# Patient Record
Sex: Female | Born: 1970 | Race: White | Hispanic: No | Smoking: Never smoker
Health system: Southern US, Community
[De-identification: ages and names within clinical notes are randomized; demographics above are authoritative.]

## PROBLEM LIST (undated history)

## (undated) DIAGNOSIS — I10 Essential (primary) hypertension: Secondary | ICD-10-CM

## (undated) DIAGNOSIS — F32A Depression, unspecified: Secondary | ICD-10-CM

## (undated) DIAGNOSIS — M51369 Other intervertebral disc degeneration, lumbar region without mention of lumbar back pain or lower extremity pain: Secondary | ICD-10-CM

## (undated) DIAGNOSIS — G4730 Sleep apnea, unspecified: Secondary | ICD-10-CM

## (undated) DIAGNOSIS — F329 Major depressive disorder, single episode, unspecified: Secondary | ICD-10-CM

## (undated) DIAGNOSIS — E039 Hypothyroidism, unspecified: Secondary | ICD-10-CM

## (undated) DIAGNOSIS — K219 Gastro-esophageal reflux disease without esophagitis: Secondary | ICD-10-CM

## (undated) DIAGNOSIS — F410 Panic disorder [episodic paroxysmal anxiety] without agoraphobia: Secondary | ICD-10-CM

## (undated) DIAGNOSIS — N189 Chronic kidney disease, unspecified: Secondary | ICD-10-CM

## (undated) DIAGNOSIS — Z87442 Personal history of urinary calculi: Secondary | ICD-10-CM

## (undated) DIAGNOSIS — D649 Anemia, unspecified: Secondary | ICD-10-CM

## (undated) DIAGNOSIS — M1990 Unspecified osteoarthritis, unspecified site: Secondary | ICD-10-CM

## (undated) DIAGNOSIS — R7303 Prediabetes: Secondary | ICD-10-CM

## (undated) DIAGNOSIS — I499 Cardiac arrhythmia, unspecified: Secondary | ICD-10-CM

## (undated) DIAGNOSIS — M5136 Other intervertebral disc degeneration, lumbar region: Secondary | ICD-10-CM

## (undated) DIAGNOSIS — Z9689 Presence of other specified functional implants: Secondary | ICD-10-CM

## (undated) DIAGNOSIS — R519 Headache, unspecified: Secondary | ICD-10-CM

## (undated) DIAGNOSIS — J189 Pneumonia, unspecified organism: Secondary | ICD-10-CM

## (undated) DIAGNOSIS — F411 Generalized anxiety disorder: Secondary | ICD-10-CM

## (undated) DIAGNOSIS — I493 Ventricular premature depolarization: Secondary | ICD-10-CM

## (undated) HISTORY — PX: CHOLECYSTECTOMY: SHX55

## (undated) HISTORY — PX: TUBAL LIGATION: SHX77

## (undated) HISTORY — DX: Hypothyroidism, unspecified: E03.9

## (undated) HISTORY — PX: COLONOSCOPY: SHX174

## (undated) HISTORY — DX: Depression, unspecified: F32.A

## (undated) HISTORY — PX: ANKLE SURGERY: SHX546

---
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
## 2010-06-28 HISTORY — PX: OTHER SURGICAL HISTORY: SHX169

## 2010-07-19 ENCOUNTER — Encounter: Payer: Self-pay | Admitting: Unknown Physician Specialty

## 2015-10-27 HISTORY — PX: COLONOSCOPY: SHX174

## 2015-11-13 ENCOUNTER — Encounter: Payer: Self-pay | Admitting: Gastroenterology

## 2016-06-28 HISTORY — PX: OTHER SURGICAL HISTORY: SHX169

## 2017-06-01 ENCOUNTER — Ambulatory Visit: Payer: Medicare Other | Admitting: Sports Medicine

## 2017-06-01 ENCOUNTER — Ambulatory Visit (INDEPENDENT_AMBULATORY_CARE_PROVIDER_SITE_OTHER): Payer: Medicare Other

## 2017-06-01 ENCOUNTER — Encounter: Payer: Self-pay | Admitting: Sports Medicine

## 2017-06-01 VITALS — BP 125/83 | HR 123 | Ht 63.0 in | Wt 289.0 lb

## 2017-06-01 DIAGNOSIS — M79671 Pain in right foot: Secondary | ICD-10-CM | POA: Diagnosis not present

## 2017-06-01 DIAGNOSIS — M79672 Pain in left foot: Secondary | ICD-10-CM | POA: Diagnosis not present

## 2017-06-01 DIAGNOSIS — M722 Plantar fascial fibromatosis: Secondary | ICD-10-CM

## 2017-06-01 MED ORDER — METHYLPREDNISOLONE 4 MG PO TBPK: ORAL_TABLET | ORAL | 0 refills | Status: DC

## 2017-06-01 MED ORDER — MELOXICAM 15 MG PO TABS: 15.0000 mg | ORAL_TABLET | Freq: Every day | ORAL | 0 refills | Status: DC

## 2017-06-01 MED ORDER — TRIAMCINOLONE ACETONIDE 10 MG/ML IJ SUSP: 10.0000 mg | Freq: Once | INTRAMUSCULAR | Status: DC

## 2017-06-01 NOTE — Progress Notes (Signed)
   Subjective:    Patient ID: Natasha Cole, female    DOB: January 12, 1971, 46 y.o.   MRN: [id number]  HPI    Review of Systems  HENT: Positive for sinus pressure and sinus pain.   Musculoskeletal: Positive for arthralgias, back pain, gait problem and myalgias.  Allergic/Immunologic: Positive for environmental allergies.  Neurological: Positive for weakness.  All other systems reviewed and are negative.      Objective:   Physical Exam        Assessment & Plan:

## 2017-06-01 NOTE — Patient Instructions (Signed)

## 2017-06-01 NOTE — Progress Notes (Signed)
Subjective: Natasha Cole is a 46 y.o. female patient presents to office with complaint of heel pain on the left and right. 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 "Just hurts". Patient has treated this problem with rest and shoes with no relief. Denies any other pedal complaints.   Prediabetic   ROS per nurse note  There are no active problems to display for this patient.   Current Outpatient Medications on File Prior to Visit  Medication Sig Dispense Refill  . busPIRone (BUSPAR) 15 MG tablet   0  . imipramine (TOFRANIL) 25 MG tablet   0  . levothyroxine (SYNTHROID, LEVOTHROID) 50 MCG tablet Take by mouth.    . lithium carbonate 150 MG capsule   0  . mirtazapine (REMERON) 45 MG tablet   0  . rosuvastatin (CRESTOR) 10 MG tablet TAKE 1 TABLET BY MOUTH ONCE DAILY    . vitamin B-12 (CYANOCOBALAMIN) 1000 MCG tablet Take by mouth.    . vitamin E 400 UNIT capsule Take by mouth.     No current facility-administered medications on file prior to visit.     No Known Allergies  Objective: Physical Exam General: The patient is alert and oriented x3 in no acute distress. Overweight   Dermatology: Old scar at right ankle. Skin is warm, dry and supple bilateral lower extremities. Nails 1-10 are normal. There is no erythema, edema, no eccymosis, no open lesions present. Integument is otherwise unremarkable.  Vascular: Dorsalis Pedis pulse and Posterior Tibial pulse are 2/4 bilateral. Capillary fill time is immediate to all digits.  Neurological: Grossly intact to light touch with an achilles reflex of +2/5 and a  negative Tinel's sign bilateral.  Musculoskeletal: Tenderness to palpation at the central calcaneal tubercale and through the insertion of the plantar fascia on the left and right foot. No pain with compression of calcaneus bilateral. No pain with tuning fork to calcaneus  bilateral. No pain with calf compression bilateral. There is decreased Ankle joint range of motion bilateral. All other joints range of motion within normal limits bilateral. Strength 5/5 in all groups bilateral.   Gait: Unassisted, Antalgic avoid weight on heels  Xray, Right/Left foot:  Large habitus. Normal osseous mineralization. Joint spaces preserved except at midfoot on right. No acute fracture/dislocation/boney destruction, + Ankle hardware from previous fracture years ago. Calcaneal spur present with mild thickening of plantar fascia. No other soft tissue abnormalities or radiopaque foreign bodies.   Assessment and Plan: Problem List Items Addressed This Visit    None    Visit Diagnoses    Plantar fasciitis    -  Primary   Relevant Medications   triamcinolone acetonide (KENALOG) 10 MG/ML injection 10 mg (Start on 06/01/2017  3:45 PM)   methylPREDNISolone (MEDROL DOSEPAK) 4 MG TBPK tablet   meloxicam (MOBIC) 15 MG tablet   Other Relevant Orders   DG Foot Complete Right   DG Foot Complete Left   Heel pain, bilateral          -Complete examination performed.  -Xrays reviewed -Discussed with patient in detail the condition of plantar fasciitis, how this occurs and general treatment options. Explained both conservative and surgical treatments.  -After oral consent and aseptic prep, injected a mixture containing 1 ml of 2%  plain lidocaine, 1 ml 0.5% plain marcaine, 0.5 ml of kenalog 10 and 0.5 ml of dexamethasone phosphate  into left and right heel. Post-injection care discussed with patient.  -Rx Meloxicam to start after Medrol dose pack is completed -Recommended good supportive shoes and advised use of OTC insert. Explained to patient that if these orthoses work well, we will continue with these. If these do not improve her condition and  pain, we will consider custom molded orthoses. -Explained and dispensed to patient daily stretching exercises. -Recommend patient to ice affected  area 1-2x daily. -Patient to return to office in 4 weeks for follow up or sooner if problems or questions arise.If no better will add on fascial bracing or night splint and consider re-injection next visit.   Asencion Islamitorya Mitch Arquette, DPM

## 2017-06-30 ENCOUNTER — Encounter: Payer: Self-pay | Admitting: Sports Medicine

## 2017-06-30 ENCOUNTER — Ambulatory Visit: Payer: Medicare Other | Admitting: Sports Medicine

## 2017-06-30 DIAGNOSIS — M79671 Pain in right foot: Secondary | ICD-10-CM

## 2017-06-30 DIAGNOSIS — M79672 Pain in left foot: Secondary | ICD-10-CM

## 2017-06-30 DIAGNOSIS — M722 Plantar fascial fibromatosis: Secondary | ICD-10-CM | POA: Diagnosis not present

## 2017-06-30 NOTE — Progress Notes (Signed)
  Subjective: Natasha Cole is a 47 y.o. female returns to office for follow up evaluation after Left and Right heel injection for plantar fasciitis, injection #1 administered 4 weeks ago. Patient states that the injection seems to help her pain; pain is now 1/10 and has decreased in frequency to the area. Patient denies any recent changes in medications or new problems since last visit.   Denies swelling/joint pain/maisea/vomiting/fever/chills/night sweats.  There are no active problems to display for this patient.   Current Outpatient Medications on File Prior to Visit  Medication Sig Dispense Refill  . busPIRone (BUSPAR) 15 MG tablet   0  . imipramine (TOFRANIL) 25 MG tablet   0  . levothyroxine (SYNTHROID, LEVOTHROID) 50 MCG tablet Take by mouth.    . lithium carbonate 150 MG capsule   0  . meloxicam (MOBIC) 15 MG tablet Take 1 tablet (15 mg total) by mouth daily. 30 tablet 0  . methylPREDNISolone (MEDROL DOSEPAK) 4 MG TBPK tablet Take as instructed 21 tablet 0  . mirtazapine (REMERON) 45 MG tablet   0  . rosuvastatin (CRESTOR) 10 MG tablet TAKE 1 TABLET BY MOUTH ONCE DAILY    . vitamin B-12 (CYANOCOBALAMIN) 1000 MCG tablet Take by mouth.    . vitamin E 400 UNIT capsule Take by mouth.     Current Facility-Administered Medications on File Prior to Visit  Medication Dose Route Frequency Provider Last Rate Last Dose  . triamcinolone acetonide (KENALOG) 10 MG/ML injection 10 mg  10 mg Other Once Asencion IslamStover, Lisamarie Coke, DPM        No Known Allergies  Objective:   General:  Alert and oriented x 3, in no acute distress. Obese female   Dermatology: Skin is warm, dry, and supple bilateral. Nails are within normal limits. There is no lower extremity erythema, no eccymosis, no open lesions present bilateral.   Vascular: Dorsalis Pedis and Posterior Tibial pedal pulses are 2/4 bilateral. + hair growth noted bilateral. Capillary Fill Time is 3 seconds in all digits. No varicosities, No edema  bilateral lower extremities.   Neurological: Sensation grossly intact to light touch bilateral.   Musculoskeletal: There is no tenderness to palpation at the medial calcaneal tubercale and through the insertion of the plantar fascia on the Left or right foot. No pain with compression to calcaneus or application of tuning fork. There is decreased Ankle joint range of motion bilateral. All other joints range of motion  within normal limits bilateral. Pes planus foot type. Strength 5/5 bilateral.   Assessment and Plan: Problem List Items Addressed This Visit    None    Visit Diagnoses    Plantar fasciitis    -  Primary   Heel pain, bilateral         -Complete examination performed.  -Previous x-rays reviewed. -Discussed with patient long term care for much improved plantar fasciitis  -Continue with stretching, icing, good supportive shoes daily for prevention -Recommend patient to consider custom orthotics; office will call patient after coverage has been checked and if patient wants them then will put her on casting schedule   -Patient to return to office as needed/casting or sooner if problems or questions arise.  Asencion Islamitorya Deegan Valentino, DPM

## 2017-07-12 DIAGNOSIS — I517 Cardiomegaly: Secondary | ICD-10-CM | POA: Diagnosis not present

## 2017-07-12 DIAGNOSIS — N183 Chronic kidney disease, stage 3 (moderate): Secondary | ICD-10-CM | POA: Diagnosis not present

## 2017-07-15 ENCOUNTER — Encounter: Payer: Self-pay | Admitting: Sports Medicine

## 2017-07-15 ENCOUNTER — Ambulatory Visit: Payer: Medicare Other | Admitting: Sports Medicine

## 2017-07-15 DIAGNOSIS — M79672 Pain in left foot: Secondary | ICD-10-CM

## 2017-07-15 DIAGNOSIS — M79671 Pain in right foot: Secondary | ICD-10-CM

## 2017-07-15 DIAGNOSIS — M722 Plantar fascial fibromatosis: Secondary | ICD-10-CM

## 2017-07-15 MED ORDER — MELOXICAM 15 MG PO TABS: 15.0000 mg | ORAL_TABLET | Freq: Every day | ORAL | 0 refills | Status: DC

## 2017-07-15 MED ORDER — TRIAMCINOLONE ACETONIDE 40 MG/ML IJ SUSP: 20.0000 mg | Freq: Once | INTRAMUSCULAR | Status: DC

## 2017-07-15 NOTE — Progress Notes (Signed)
Subjective: Natasha Cole is a 47 y.o. female returns to office for follow up evaluation after Left and Right heel injection for plantar fasciitis, injection #1 administered 6 weeks ago. Patient states that the pain started to come back about a week ago after she finished all of her medications and it is more painful than before.  Patient denies any recent changes in medications or new problems since last visit.   Denies swelling/joint pain/nausea/vomiting/fever/chills/night sweats.  There are no active problems to display for this patient.   Current Outpatient Medications on File Prior to Visit  Medication Sig Dispense Refill  . busPIRone (BUSPAR) 15 MG tablet   0  . imipramine (TOFRANIL) 25 MG tablet   0  . levothyroxine (SYNTHROID, LEVOTHROID) 50 MCG tablet Take by mouth.    . lithium carbonate 150 MG capsule   0  . methylPREDNISolone (MEDROL DOSEPAK) 4 MG TBPK tablet Take as instructed 21 tablet 0  . mirtazapine (REMERON) 45 MG tablet   0  . rosuvastatin (CRESTOR) 10 MG tablet TAKE 1 TABLET BY MOUTH ONCE DAILY    . vitamin B-12 (CYANOCOBALAMIN) 1000 MCG tablet Take by mouth.    . vitamin E 400 UNIT capsule Take by mouth.     Current Facility-Administered Medications on File Prior to Visit  Medication Dose Route Frequency Provider Last Rate Last Dose  . triamcinolone acetonide (KENALOG) 10 MG/ML injection 10 mg  10 mg Other Once Asencion IslamStover, Alka Falwell, DPM        No Known Allergies  Objective:   General:  Alert and oriented x 3, in no acute distress. Obese female   Dermatology: Skin is warm, dry, and supple bilateral. Nails are within normal limits. There is no lower extremity erythema, no eccymosis, no open lesions present bilateral.   Vascular: Dorsalis Pedis and Posterior Tibial pedal pulses are 2/4 bilateral. + hair growth noted bilateral. Capillary Fill Time is 3 seconds in all digits. No varicosities, No edema bilateral lower extremities.   Neurological: Sensation grossly  intact to light touch bilateral.   Musculoskeletal: There is tenderness to palpation at the medial calcaneal tubercale and through the insertion of the plantar fascia on the Left or right foot. No pain with compression to calcaneus or application of tuning fork. There is decreased Ankle joint range of motion bilateral. All other joints range of motion  within normal limits bilateral. Pes planus foot type. Strength 5/5 bilateral.   Assessment and Plan: Problem List Items Addressed This Visit    None    Visit Diagnoses    Plantar fasciitis    -  Primary   Relevant Medications   meloxicam (MOBIC) 15 MG tablet   triamcinolone acetonide (KENALOG-40) injection 20 mg   Heel pain, bilateral       Relevant Medications   triamcinolone acetonide (KENALOG-40) injection 20 mg     -Complete examination performed.  -Previous x-rays reviewed. -Discussed with patient long term care for much improved plantar fasciitis  After oral consent and aseptic prep, injected a mixture containing 1 ml of 2%  plain lidocaine, 1 ml 0.5% plain marcaine, 0.5 ml of kenalog 40 and 0.5 ml of dexamethasone phosphate into right and left heel without complication. Post-injection care discussed with patient.  -Dispensed night splint -Refilled meloxicam -Continue with stretching, icing, good supportive shoes daily for prevention; Recommend power steps or super feet   -Patient to return to office as 4-5 weeks for follow-up evaluation or sooner if problems or questions arise.  Asencion Islamitorya Melbert Botelho, DPM

## 2017-07-15 NOTE — Patient Instructions (Signed)
For tennis shoes recommend:  Anne [employer]utter[employer]oks Beast Ascis New balance Saucony Can be purchased at [employer] or [employer]  Vionic  SAS Can be purchased at [employer] or [employer]   For work shoes recommend: [employer] boots  Can be purchased at a variety of places or [employer]   For casual shoes recommend: Vionic  Can be purchased at [employer] or [employer]   For [employer] Orth[employer] Power steps- sold in our office Super feet- Can be purchased at [employer] or [employer]

## 2017-08-19 ENCOUNTER — Ambulatory Visit: Payer: Medicare Other | Admitting: Sports Medicine

## 2017-08-23 ENCOUNTER — Telehealth: Payer: Self-pay

## 2017-08-23 MED ORDER — MELOXICAM 15 MG PO TABS: 15.0000 mg | ORAL_TABLET | Freq: Every day | ORAL | 0 refills | Status: DC

## 2017-08-23 NOTE — Telephone Encounter (Signed)
Yes ok to refill and then make appt for 4-6 weeks for follow up Thanks Dr. Marylene LandStover

## 2017-08-23 NOTE — Telephone Encounter (Signed)
Patient called stating that the Meloxicam that was prescribed for her helps with her ankle pain, but when she stops taking it the ankle pain returns.

## 2017-08-23 NOTE — Telephone Encounter (Signed)
Spoke with patient, informing her of the refill of her meloxicam and to follow up in 4-6 weeks with Dr Marylene LandStover

## 2017-10-21 ENCOUNTER — Encounter

## 2017-10-21 ENCOUNTER — Ambulatory Visit: Payer: Medicare Other | Admitting: Sports Medicine

## 2017-10-21 ENCOUNTER — Encounter: Payer: Self-pay | Admitting: Sports Medicine

## 2017-10-21 DIAGNOSIS — M79671 Pain in right foot: Secondary | ICD-10-CM | POA: Diagnosis not present

## 2017-10-21 DIAGNOSIS — M79672 Pain in left foot: Secondary | ICD-10-CM | POA: Diagnosis not present

## 2017-10-21 DIAGNOSIS — M722 Plantar fascial fibromatosis: Secondary | ICD-10-CM | POA: Diagnosis not present

## 2017-10-21 MED ORDER — DICLOFENAC SODIUM 75 MG PO TBEC: 75.0000 mg | DELAYED_RELEASE_TABLET | Freq: Two times a day (BID) | ORAL | 0 refills | Status: DC

## 2017-10-21 NOTE — Progress Notes (Signed)
Subjective: Natasha Cole is a 47 y.o. female returns to office for follow up evaluation after Left and Right heel pain.  Patient had last injection 3 months ago. Patient states pain is now back to the 10 and reports that she has been doing everything stretching icing wearing night splint good supportive shoes cushions pads with no additional relief has completed her meloxicam which helps a little.  Patient denies any recent changes in medications or new problems since last visit.   Denies swelling/joint pain/nausea/vomiting/fever/chills/night sweats.  There are no active problems to display for this patient.   Current Outpatient Medications on File Prior to Visit  Medication Sig Dispense Refill  . busPIRone (BUSPAR) 15 MG tablet   0  . imipramine (TOFRANIL) 25 MG tablet   0  . levothyroxine (SYNTHROID, LEVOTHROID) 50 MCG tablet Take by mouth.    . lithium carbonate 150 MG capsule   0  . meloxicam (MOBIC) 15 MG tablet Take 1 tablet (15 mg total) by mouth daily. 30 tablet 0  . meloxicam (MOBIC) 15 MG tablet Take 1 tablet (15 mg total) by mouth daily. 30 tablet 0  . methylPREDNISolone (MEDROL DOSEPAK) 4 MG TBPK tablet Take as instructed 21 tablet 0  . mirtazapine (REMERON) 45 MG tablet   0  . rosuvastatin (CRESTOR) 10 MG tablet TAKE 1 TABLET BY MOUTH ONCE DAILY    . vitamin B-12 (CYANOCOBALAMIN) 1000 MCG tablet Take by mouth.    . vitamin E 400 UNIT capsule Take by mouth.     Current Facility-Administered Medications on File Prior to Visit  Medication Dose Route Frequency Provider Last Rate Last Dose  . triamcinolone acetonide (KENALOG) 10 MG/ML injection 10 mg  10 mg Other Once [place], Reichen Hutzler, DPM      . triamcinolone acetonide (KENALOG-40) injection 20 mg  20 mg Other Once Asencion Islam, DPM        No Known Allergies  Objective:   General:  Alert and oriented x 3, in no acute distress. Obese female   Dermatology: Skin is warm, dry, and supple bilateral. Nails are within  normal limits. There is no lower extremity erythema, no eccymosis, no open lesions present bilateral.   Vascular: Dorsalis Pedis and Posterior Tibial pedal pulses are 2/4 bilateral. + hair growth noted bilateral. Capillary Fill Time is 3 seconds in all digits. No varicosities, No edema bilateral lower extremities.   Neurological: Sensation grossly intact to light touch bilateral.   Musculoskeletal: There is tenderness to palpation at the medial calcaneal tubercale and through the insertion of the plantar fascia on the Left or right foot. No pain with compression to calcaneus or application of tuning fork. There is decreased Ankle joint range of motion bilateral. All other joints range of motion  within normal limits bilateral. Pes planus foot type. Strength 5/5 bilateral.   Assessment and Plan: Problem List Items Addressed This Visit    None    Visit Diagnoses    Plantar fasciitis    -  Primary   Heel pain, bilateral         -Complete examination performed.  -Previous x-rays reviewed. -Discussed with patient long term care for recurrent plantar fasciitis  -Advised patient to consider physical therapy, shockwave therapy or surgery patient would like to think about these options and call back to let us know what she decides. -Change meloxicam to diclofenac to see if this will give patient any additional relief -Continue with night splint, stretching, icing, good supportive shoes and insoles daily  for prevention of worsening of symptoms -Patient to return to office after physical therapy or shockwave treatment or for surgery consult once she decides what she wants to do or sooner if problems or questions arise.  Asencion Islamitorya Meggan Dhaliwal, DPM

## 2017-10-26 ENCOUNTER — Ambulatory Visit: Payer: Medicare Other | Admitting: Sports Medicine

## 2017-11-02 ENCOUNTER — Telehealth: Payer: Self-pay | Admitting: Sports Medicine

## 2017-11-02 MED ORDER — DICLOFENAC SODIUM 75 MG PO TBEC: 75.0000 mg | DELAYED_RELEASE_TABLET | Freq: Two times a day (BID) | ORAL | 0 refills | Status: DC

## 2017-11-02 NOTE — Telephone Encounter (Signed)
I was calling to see if Dr. Marylene Land would call in a refill on the Voltaren. It is helping some so I wanted to know if she could refill it for a month's worth. Thank you.

## 2017-11-02 NOTE — Addendum Note (Signed)
Addended by: Alphia Kava D on: 11/02/2017 11:24 AM   Modules accepted: Orders

## 2017-11-02 NOTE — Telephone Encounter (Signed)
Left message informing pt, Dr. Marylene Land had refilled the diclofenac and if she continued to have problems to make an appt to see Dr. Marylene Land.

## 2017-11-22 ENCOUNTER — Other Ambulatory Visit: Payer: Self-pay | Admitting: Sports Medicine

## 2017-12-07 ENCOUNTER — Other Ambulatory Visit: Payer: Self-pay | Admitting: Sports Medicine

## 2017-12-09 ENCOUNTER — Ambulatory Visit: Payer: Medicare Other | Admitting: Sports Medicine

## 2017-12-14 ENCOUNTER — Ambulatory Visit: Payer: Medicare Other | Admitting: Sports Medicine

## 2017-12-14 ENCOUNTER — Encounter: Payer: Self-pay | Admitting: Sports Medicine

## 2017-12-14 DIAGNOSIS — M722 Plantar fascial fibromatosis: Secondary | ICD-10-CM | POA: Diagnosis not present

## 2017-12-14 DIAGNOSIS — M79671 Pain in right foot: Secondary | ICD-10-CM | POA: Diagnosis not present

## 2017-12-14 DIAGNOSIS — M79672 Pain in left foot: Secondary | ICD-10-CM

## 2017-12-14 MED ORDER — DICLOFENAC SODIUM 75 MG PO TBEC: 75.0000 mg | DELAYED_RELEASE_TABLET | Freq: Two times a day (BID) | ORAL | 5 refills | Status: DC

## 2017-12-14 NOTE — Progress Notes (Signed)
Subjective: Natasha Cole is a 47 y.o. female returns to office for follow up evaluation after Left and Right heel pain.  Patient reports that pain is doing better the only time she has pain is when she gets up to go to the bathroom in the middle of the night states that the diclofenac medication has really helped and she would like to continue on this.  Patient denies any recent changes in medications or new problems since last visit.   Denies swelling/joint pain/nausea/vomiting/fever/chills/night sweats.  There are no active problems to display for this patient.   Current Outpatient Medications on File Prior to Visit  Medication Sig Dispense Refill  . busPIRone (BUSPAR) 15 MG tablet   0  . imipramine (TOFRANIL) 25 MG tablet   0  . levothyroxine (SYNTHROID, LEVOTHROID) 50 MCG tablet Take by mouth.    . lithium carbonate 150 MG capsule   0  . meloxicam (MOBIC) 15 MG tablet Take 1 tablet (15 mg total) by mouth daily. 30 tablet 0  . meloxicam (MOBIC) 15 MG tablet Take 1 tablet (15 mg total) by mouth daily. 30 tablet 0  . methylPREDNISolone (MEDROL DOSEPAK) 4 MG TBPK tablet Take as instructed 21 tablet 0  . mirtazapine (REMERON) 45 MG tablet   0  . rosuvastatin (CRESTOR) 10 MG tablet TAKE 1 TABLET BY MOUTH ONCE DAILY    . vitamin B-12 (CYANOCOBALAMIN) 1000 MCG tablet Take by mouth.    . vitamin E 400 UNIT capsule Take by mouth.     Current Facility-Administered Medications on File Prior to Visit  Medication Dose Route Frequency Provider Last Rate Last Dose  . triamcinolone acetonide (KENALOG) 10 MG/ML injection 10 mg  10 mg Other Once [place], Aslin Farinas, DPM      . triamcinolone acetonide (KENALOG-40) injection 20 mg  20 mg Other Once Asencion Islam, DPM        No Known Allergies  Objective:   General:  Alert and oriented x 3, in no acute distress. Obese female   Dermatology: Skin is warm, dry, and supple bilateral. Nails are within normal limits. There is no lower extremity  erythema, no eccymosis, no open lesions present bilateral.   Vascular: Dorsalis Pedis and Posterior Tibial pedal pulses are 2/4 bilateral. + hair growth noted bilateral. Capillary Fill Time is 3 seconds in all digits. No varicosities, No edema bilateral lower extremities.   Neurological: Sensation grossly intact to light touch bilateral.   Musculoskeletal: There is minimal tenderness to palpation at the medial calcaneal tubercale and through the insertion of the plantar fascia on the Left or right foot. No pain with compression to calcaneus or application of tuning fork. There is decreased Ankle joint range of motion bilateral. All other joints range of motion  within normal limits bilateral. Pes planus foot type. Strength 5/5 bilateral.   Assessment and Plan: Problem List Items Addressed This Visit    None    Visit Diagnoses    Plantar fasciitis    -  Primary   Relevant Medications   diclofenac (VOLTAREN) 75 MG EC tablet   Heel pain, bilateral       Relevant Medications   diclofenac (VOLTAREN) 75 MG EC tablet     -Complete examination performed.  -Previous x-rays reviewed. -Discussed with patient long term care for plantar fasciitis  -Refill diclofenac -Continue with night splint, stretching, icing, good supportive shoes and insoles daily for prevention of worsening of symptoms -Patient to return to office as needed or sooner if problems  or questions arise.  Advised patient if she experiences a flare and should strongly consider physical therapy, shockwave treatment, or surgery patient expressed understanding and states that if pain worsens she will return and let us know if she decides on doing any of these more advanced treatments.  Asencion Islamitorya Addilyn Satterwhite, DPM

## 2018-04-12 ENCOUNTER — Other Ambulatory Visit: Payer: Self-pay | Admitting: Psychiatry

## 2018-04-17 ENCOUNTER — Other Ambulatory Visit: Payer: Self-pay | Admitting: Psychiatry

## 2018-05-08 ENCOUNTER — Other Ambulatory Visit: Payer: Self-pay | Admitting: Psychiatry

## 2018-05-12 ENCOUNTER — Ambulatory Visit: Payer: Self-pay | Admitting: Psychiatry

## 2018-05-15 ENCOUNTER — Other Ambulatory Visit: Payer: Self-pay | Admitting: Psychiatry

## 2018-05-30 ENCOUNTER — Encounter: Payer: Self-pay | Admitting: Emergency Medicine

## 2018-05-30 DIAGNOSIS — F419 Anxiety disorder, unspecified: Secondary | ICD-10-CM | POA: Insufficient documentation

## 2018-05-30 DIAGNOSIS — F32A Depression, unspecified: Secondary | ICD-10-CM | POA: Insufficient documentation

## 2018-05-30 DIAGNOSIS — F329 Major depressive disorder, single episode, unspecified: Secondary | ICD-10-CM | POA: Insufficient documentation

## 2018-06-05 ENCOUNTER — Other Ambulatory Visit: Payer: Self-pay | Admitting: Psychiatry

## 2018-06-05 NOTE — Telephone Encounter (Signed)
Need to review paper chart  

## 2018-06-12 ENCOUNTER — Encounter (INDEPENDENT_AMBULATORY_CARE_PROVIDER_SITE_OTHER): Payer: Self-pay

## 2018-06-12 ENCOUNTER — Ambulatory Visit: Payer: Medicare Other | Admitting: Psychiatry

## 2018-06-12 DIAGNOSIS — F3289 Other specified depressive episodes: Secondary | ICD-10-CM | POA: Diagnosis not present

## 2018-06-12 DIAGNOSIS — F419 Anxiety disorder, unspecified: Secondary | ICD-10-CM

## 2018-06-12 MED ORDER — LITHIUM CARBONATE 150 MG PO CAPS: 450.0000 mg | ORAL_CAPSULE | Freq: Every day | ORAL | 3 refills | Status: DC

## 2018-06-12 MED ORDER — PRAMIPEXOLE DIHYDROCHLORIDE 0.125 MG PO TABS: ORAL_TABLET | ORAL | 2 refills | Status: DC

## 2018-06-12 MED ORDER — IMIPRAMINE HCL 25 MG PO TABS: 25.0000 mg | ORAL_TABLET | Freq: Every day | ORAL | 2 refills | Status: DC

## 2018-06-12 MED ORDER — BUSPIRONE HCL 15 MG PO TABS: 30.0000 mg | ORAL_TABLET | Freq: Two times a day (BID) | ORAL | 2 refills | Status: DC

## 2018-06-12 MED ORDER — MIRTAZAPINE 45 MG PO TABS: 45.0000 mg | ORAL_TABLET | Freq: Every day | ORAL | 2 refills | Status: DC

## 2018-06-12 NOTE — Progress Notes (Signed)
[facility]  Patient ID: Natasha Cole,  MRN: [id number]  PCP: Krystal ClarkBrown-Patram, Melissa Joyce, NP  Date of Evaluation: 06/12/2018 Time spent:20 minutes  Chief Complaint:   HISTORY/CURRENT STATUS: HPI patient seen 01/26/2018.  At the time her depression and memory were the same but anxiety was worse.  She tried Aricept for her memory but that had side effects so she stopped. Currently the anxiety is the same, depression may be slightly better without suicidal thoughts, Memory about the same.  Individual Medical History/ Review of Systems: Changes? :No   Allergies: Patient has no known allergies.  Current Medications:  Current Outpatient Medications:  .  busPIRone (BUSPAR) 15 MG tablet, Take 2 tablets (30 mg total) by mouth 2 (two) times daily., Disp: 120 tablet, Rfl: 2 .  imipramine (TOFRANIL) 10 MG tablet, TAKE 4 TABLETS BY MOUTH EVERY NIGHT AT BEDTIME, Disp: 120 tablet, Rfl: 0 .  lithium carbonate 150 MG capsule, Take 3 capsules (450 mg total) by mouth daily., Disp: 90 capsule, Rfl: 3 .  mirtazapine (REMERON) 45 MG tablet, Take 1 tablet (45 mg total) by mouth daily. as directed, Disp: 30 tablet, Rfl: 2 .  diclofenac (VOLTAREN) 75 MG EC tablet, Take 1 tablet (75 mg total) by mouth 2 (two) times daily., Disp: 60 tablet, Rfl: 5 .  donepezil (ARICEPT) 5 MG tablet, Take 5 mg by mouth at bedtime., Disp: , Rfl:  .  levothyroxine (SYNTHROID, LEVOTHROID) 50 MCG tablet, Take by mouth., Disp: , Rfl:  .  meloxicam (MOBIC) 15 MG tablet, Take 1 tablet (15 mg total) by mouth daily., Disp: 30 tablet, Rfl: 0 .  meloxicam (MOBIC) 15 MG tablet, Take 1 tablet (15 mg total) by mouth daily., Disp: 30 tablet, Rfl: 0 .  methylPREDNISolone (MEDROL DOSEPAK) 4 MG TBPK tablet, Take as instructed, Disp: 21 tablet, Rfl: 0 .  pramipexole (MIRAPEX) 0.125 MG tablet, Bid for a week, then 2 bid, Disp: 120 tablet, Rfl: 2 .  rosuvastatin (CRESTOR) 10 MG tablet, TAKE 1 TABLET BY MOUTH ONCE DAILY, Disp: ,  Rfl:  .  vitamin B-12 (CYANOCOBALAMIN) 1000 MCG tablet, Take by mouth., Disp: , Rfl:  .  vitamin E 400 UNIT capsule, Take by mouth., Disp: , Rfl:   Current Facility-Administered Medications:  .  triamcinolone acetonide (KENALOG) 10 MG/ML injection 10 mg, 10 mg, Other, Once, Stover, Titorya, DPM .  triamcinolone acetonide (KENALOG-40) injection 20 mg, 20 mg, Other, Once, Asencion IslamStover, Titorya, DPM Medication Side Effects: none  Family Medical/ Social History: Changes? none  MENTAL HEALTH EXAM:  There were no vitals taken for this visit.There is no height or weight on file to calculate BMI.  General Appearance: Casual  Eye Contact:  Fair  Speech:  Normal Rate  Volume:  Normal  Mood:  Depressed anxious  Affect:  Appropriate  Thought Process:  Goal Directed  Orientation:  Full (Time, Place, and Person)  Thought Content: Logical   Suicidal Thoughts:  No  Homicidal Thoughts:  No  Memory:  impaired  Judgement:  Fair  Insight:  Fair  Psychomotor Activity:  Normal  Concentration:  Concentration: Fair  Recall:  Good  Fund of Knowledge: Good  Language: Good  Assets:  Social Support  ADL's:  Intact  Cognition: WNL  Prognosis:  Good    DIAGNOSES:    ICD-10-CM   1. Other depression F32.89   2. Anxiety F41.9     Receiving Psychotherapy: No    RECOMMENDATIONS: Overall the patient may be doing slightly better.  Still has  problems with lack of memory and feeling numb.  Also complains of itching for months for a year. I want her to decrease her imipramine from 40 mg a day to 25 mg a day to see if that benefits her itching.  She can use Benadryl for itching.  She denies rash or shortness of breath.  Continue lithium 150  3 a day continue BuSpar 15 mg 2 twice a day.  Continue Remeron 45 mg at bed.  She is to start Mirapex 0.125 twice a day for a week and then 2 pills twice a day. I have her do a lithium level and creatinine.  She needs creatinine level every 6 months. She is to return in 1  month.   Anne Fu, PA-C

## 2018-07-05 LAB — CREATININE, SERUM
Creatinine, Ser: 1.12 mg/dL — ABNORMAL HIGH (ref 0.57–1.00)
GFR calc Af Amer: 67 mL/min/{1.73_m2} (ref >59)
GFR calc non Af Amer: 58 mL/min/{1.73_m2} — ABNORMAL LOW (ref >59)

## 2018-07-05 LAB — LITHIUM LEVEL: LITHIUM LVL: 0.4 mmol/L — AB (ref 0.6–1.2)

## 2018-07-05 NOTE — Progress Notes (Signed)
Natasha Cole, Please call pt with results. Needs to increase Lithium 150mg  to 4/day Tell her of cr result, better than 2018 results. Send labs to pcp I will discuss serotonin syndrome at revisit. Thanks, Mat Carne

## 2018-07-06 ENCOUNTER — Other Ambulatory Visit: Payer: Self-pay

## 2018-07-06 MED ORDER — LITHIUM CARBONATE 150 MG PO CAPS: 600.0000 mg | ORAL_CAPSULE | Freq: Every day | ORAL | 3 refills | Status: DC

## 2018-07-06 NOTE — Progress Notes (Signed)
Pt given information and verbalized understanding of side effects, etc. Needs Rx sent to [facility] with updated dosage.

## 2018-07-10 ENCOUNTER — Other Ambulatory Visit: Payer: Self-pay | Admitting: Psychiatry

## 2018-07-10 ENCOUNTER — Ambulatory Visit: Payer: Medicare Other | Admitting: Psychiatry

## 2018-07-17 ENCOUNTER — Telehealth: Payer: Self-pay | Admitting: Psychiatry

## 2018-07-17 NOTE — Telephone Encounter (Signed)
Patient called and said she wants to know which medicine you talked about it be the one that makes her itch. She also needs to know how to go down on the dosage on thst medicine. Please give her a call at [phone].

## 2018-08-07 ENCOUNTER — Other Ambulatory Visit: Payer: Self-pay | Admitting: Psychiatry

## 2018-08-09 ENCOUNTER — Other Ambulatory Visit: Payer: Self-pay | Admitting: Psychiatry

## 2018-08-10 NOTE — Telephone Encounter (Signed)
Please address

## 2018-08-14 ENCOUNTER — Other Ambulatory Visit: Payer: Self-pay | Admitting: Psychiatry

## 2018-09-11 ENCOUNTER — Other Ambulatory Visit: Payer: Self-pay | Admitting: Psychiatry

## 2018-10-09 ENCOUNTER — Other Ambulatory Visit: Payer: Self-pay | Admitting: Psychiatry

## 2018-10-09 NOTE — Telephone Encounter (Signed)
I'll go ahead and send in but needs to make an appt or no more RF.

## 2018-10-09 NOTE — Telephone Encounter (Signed)
Hasn't been back in to follow up with Mat Carne since 05/2018, refills ok and schedule appt with you or ?  Thanks

## 2018-10-26 ENCOUNTER — Ambulatory Visit: Payer: Medicare Other | Admitting: Physician Assistant

## 2018-10-26 ENCOUNTER — Other Ambulatory Visit: Payer: Self-pay

## 2018-10-26 ENCOUNTER — Encounter: Payer: Self-pay | Admitting: Physician Assistant

## 2018-10-26 DIAGNOSIS — F331 Major depressive disorder, recurrent, moderate: Secondary | ICD-10-CM

## 2018-10-26 DIAGNOSIS — F411 Generalized anxiety disorder: Secondary | ICD-10-CM

## 2018-10-26 MED ORDER — LITHIUM CARBONATE 150 MG PO CAPS: 600.0000 mg | ORAL_CAPSULE | Freq: Every day | ORAL | 1 refills | Status: DC

## 2018-10-26 MED ORDER — MIRTAZAPINE 45 MG PO TABS: 45.0000 mg | ORAL_TABLET | Freq: Every day | ORAL | 1 refills | Status: DC

## 2018-10-26 MED ORDER — BUSPIRONE HCL 30 MG PO TABS: 30.0000 mg | ORAL_TABLET | Freq: Two times a day (BID) | ORAL | 1 refills | Status: DC

## 2018-10-26 MED ORDER — IMIPRAMINE HCL 10 MG PO TABS: ORAL_TABLET | ORAL | 0 refills | Status: DC

## 2018-10-26 MED ORDER — PRAMIPEXOLE DIHYDROCHLORIDE 0.125 MG PO TABS: 0.2500 mg | ORAL_TABLET | Freq: Every day | ORAL | 1 refills | Status: DC

## 2018-10-26 NOTE — Progress Notes (Signed)
[facility]  Patient ID: Natasha Cole,  MRN: [id number]  PCP: Krystal ClarkBrown-Patram, Melissa Joyce, NP  Date of Evaluation: 10/26/2018 Time spent:15 minutes  Chief Complaint:  Chief Complaint    Follow-up     Virtual Visit via Telephone Note  I connected with patient by a video enabled telemedicine application or telephone, with their informed consent, and verified patient privacy and that I am speaking with the correct person using two identifiers.  I am private, in my home and the patient is home.   I discussed the limitations, risks, security and privacy concerns of performing an evaluation and management service by telephone and the availability of in person appointments. I also discussed with the patient that there may be a patient responsible charge related to this service. The patient expressed understanding and agreed to proceed.   I discussed the assessment and treatment plan with the patient. The patient was provided an opportunity to ask questions and all were answered. The patient agreed with the plan and demonstrated an understanding of the instructions.   The patient was advised to call back or seek an in-person evaluation if the symptoms worsen or if the condition fails to improve as anticipated.  I provided 15 minutes of non-face-to-face time during this encounter.  HISTORY/CURRENT STATUS: HPI for routine med check.  And that is a former patient of my colleague, Anne FuClay Shugart, [place] who recently passed away.  The patient is being transferred to my care.  Patient reports that she still has itching all over every day.  She denies any rash.  She reports this happened once the imipramine was added.  It improved slightly when the dose was decreased about 3 months ago.  She does not feel like the drug has helped her anyway so wonders if she can go off of it.  Overall she states she is doing well.  Her mood is good. Patient denies loss of interest in usual activities and  is able to enjoy things.  Denies decreased energy or motivation.  Appetite has not changed.  No extreme sadness, tearfulness, or feelings of hopelessness.  Denies any changes in concentration, making decisions or remembering things.  Denies suicidal or homicidal thoughts.  She sleeps pretty well and the pramipexole helps with the jerking and weird movements in her legs.  Well controlled with current treatment.  No panic attacks.  Denies muscle or joint pain, stiffness, or dystonia.  Denies dizziness, syncope, seizures, numbness, tingling, tremor, tics, unsteady gait, slurred speech, confusion.   Individual Medical History/ Review of Systems: Changes? :No    Past medications for mental health diagnoses include: unknown  Allergies: Patient has no known allergies.  Current Medications:  Current Outpatient Medications:  .  levothyroxine (SYNTHROID, LEVOTHROID) 50 MCG tablet, Take by mouth., Disp: , Rfl:  .  lithium carbonate 150 MG capsule, Take 4 capsules (600 mg total) by mouth daily., Disp: 360 capsule, Rfl: 1 .  mirtazapine (REMERON) 45 MG tablet, Take 1 tablet (45 mg total) by mouth daily. as directed, Disp: 90 tablet, Rfl: 1 .  pramipexole (MIRAPEX) 0.125 MG tablet, Take 2 tablets (0.25 mg total) by mouth at bedtime., Disp: 180 tablet, Rfl: 1 .  rosuvastatin (CRESTOR) 10 MG tablet, TAKE 1 TABLET BY MOUTH ONCE DAILY, Disp: , Rfl:  .  vitamin E 400 UNIT capsule, Take by mouth., Disp: , Rfl:  .  busPIRone (BUSPAR) 30 MG tablet, Take 1 tablet (30 mg total) by mouth 2 (two) times daily., Disp: 180 tablet,  Rfl: 1 .  diclofenac (VOLTAREN) 75 MG EC tablet, Take 1 tablet (75 mg total) by mouth 2 (two) times daily. (Patient not taking: Reported on 10/26/2018), Disp: 60 tablet, Rfl: 5 .  imipramine (TOFRANIL) 10 MG tablet, Take 2 po qhs for 4 nights, then 1 po qd for 4 days, then stop., Disp: 11 tablet, Rfl: 0 .  meloxicam (MOBIC) 15 MG tablet, Take 1 tablet (15 mg total) by mouth daily. (Patient not  taking: Reported on 10/26/2018), Disp: 30 tablet, Rfl: 0 .  meloxicam (MOBIC) 15 MG tablet, Take 1 tablet (15 mg total) by mouth daily. (Patient not taking: Reported on 10/26/2018), Disp: 30 tablet, Rfl: 0 .  methylPREDNISolone (MEDROL DOSEPAK) 4 MG TBPK tablet, Take as instructed (Patient not taking: Reported on 10/26/2018), Disp: 21 tablet, Rfl: 0 .  vitamin B-12 (CYANOCOBALAMIN) 1000 MCG tablet, Take by mouth., Disp: , Rfl:   Current Facility-Administered Medications:  .  triamcinolone acetonide (KENALOG) 10 MG/ML injection 10 mg, 10 mg, Other, Once, Stover, Titorya, DPM .  triamcinolone acetonide (KENALOG-40) injection 20 mg, 20 mg, Other, Once, Asencion Islam, DPM Medication Side Effects: none  Family Medical/ Social History: Changes? No  MENTAL HEALTH EXAM:  There were no vitals taken for this visit.There is no height or weight on file to calculate BMI.  General Appearance: unable to assess  Eye Contact:  unable to assess  Speech:  Clear and Coherent  Volume:  Normal  Mood:  Euthymic  Affect:  unable to assess  Thought Process:  Goal Directed  Orientation:  Full (Time, Place, and Person)  Thought Content: Logical   Suicidal Thoughts:  No  Homicidal Thoughts:  No  Memory:  WNL  Judgement:  Good  Insight:  Good  Psychomotor Activity:  unable to assess  Concentration:  Concentration: Good  Recall:  Good  Fund of Knowledge: Good  Language: Good  Assets:  Desire for Improvement  ADL's:  Intact  Cognition: WNL  Prognosis:  Good  Labs 07/04/2018 lithium was 0.4, creatinine was 1.12  DIAGNOSES:    ICD-10-CM   1. Generalized anxiety disorder F41.1   2. Major depressive disorder, recurrent episode, moderate (HCC) F33.1     Receiving Psychotherapy: No    RECOMMENDATIONS:  Wean off imipramine 10 mg 2 p.o. nightly for 4 nights then 1 p.o. nightly for 4 nights and then stop. Continue BuSpar 30 mg 1 twice daily. Continue lithium 600 mg daily. Continue Remeron 45 mg  nightly Continue pramipexole 0.125 mg, 2 nightly. Return in 6 months or sooner as needed.  Melony Overly, PA-C   This record has been created using [employer].  Chart creation errors have been sought, but may not always have been located and corrected. Such creation errors do not reflect on the standard of medical care.

## 2018-12-18 ENCOUNTER — Telehealth: Payer: Self-pay | Admitting: Physician Assistant

## 2018-12-18 ENCOUNTER — Other Ambulatory Visit: Payer: Self-pay

## 2018-12-18 MED ORDER — PRAMIPEXOLE DIHYDROCHLORIDE 0.125 MG PO TABS: 0.2500 mg | ORAL_TABLET | Freq: Two times a day (BID) | ORAL | 0 refills | Status: DC

## 2018-12-18 NOTE — Telephone Encounter (Signed)
Please let her know I'm sorry, that was my mistake.  Send in pramipexole 0.125 #120  Sig 2 po bid.  Rf 3.  Or I can send in. Need pharmacy.

## 2018-12-18 NOTE — Telephone Encounter (Signed)
rx submitted.  

## 2018-12-18 NOTE — Telephone Encounter (Signed)
Patient stated script for Marapex was different on how much she is to take per day before she was taking 2 pills 2x a day, the new script states take 2 pills @ bedtime.

## 2019-01-01 ENCOUNTER — Encounter: Payer: Self-pay | Admitting: Allergy and Immunology

## 2019-01-01 ENCOUNTER — Other Ambulatory Visit: Payer: Self-pay

## 2019-01-01 ENCOUNTER — Ambulatory Visit: Payer: Medicare Other | Admitting: Allergy and Immunology

## 2019-01-01 VITALS — BP 152/92 | HR 76 | Temp 98.0°F | Resp 16 | Ht 63.78 in | Wt 325.0 lb

## 2019-01-01 DIAGNOSIS — L299 Pruritus, unspecified: Secondary | ICD-10-CM | POA: Diagnosis not present

## 2019-01-01 DIAGNOSIS — D721 Eosinophilia, unspecified: Secondary | ICD-10-CM

## 2019-01-01 DIAGNOSIS — L308 Other specified dermatitis: Secondary | ICD-10-CM | POA: Diagnosis not present

## 2019-01-01 DIAGNOSIS — K219 Gastro-esophageal reflux disease without esophagitis: Secondary | ICD-10-CM | POA: Diagnosis not present

## 2019-01-01 DIAGNOSIS — L989 Disorder of the skin and subcutaneous tissue, unspecified: Secondary | ICD-10-CM

## 2019-01-01 MED ORDER — MOMETASONE FUROATE 0.1 % EX OINT: TOPICAL_OINTMENT | Freq: Every day | CUTANEOUS | 5 refills | Status: DC

## 2019-01-01 MED ORDER — FAMOTIDINE 20 MG PO TABS: 20.0000 mg | ORAL_TABLET | Freq: Two times a day (BID) | ORAL | 5 refills | Status: DC

## 2019-01-01 MED ORDER — MONTELUKAST SODIUM 10 MG PO TABS: 10.0000 mg | ORAL_TABLET | Freq: Every day | ORAL | 5 refills | Status: DC

## 2019-01-01 NOTE — Patient Instructions (Addendum)
  1.  Allergen avoidance measures?  2.  Cetirizine 10 mg - 1-2 tablets 1-2 times a day (40 mg max)  3.  Famotidine 20 mg - 1 tablet twice a day  4.  Montelukast 10 mg - 1 tablet once a day  5.  Shower followed by mometasone 0.1% ointment to damage skin daily until healed  6.  Review all blood tests.  Further testing?  7.  Return to clinic in 3 weeks or earlier if problem

## 2019-01-01 NOTE — Progress Notes (Signed)
[place]   Dear Dr. Sylvan CheeseBrown-Patram,  Thank you for referring Natasha Cole to the [facility] [place] on 01/01/2019.   Below is a summation of this patient's evaluation and recommendations.  Thank you for your referral. I will keep you informed about this patient's response to treatment.   If you have any questions please do not hesitate to contact me.   Sincerely,  Jessica PriestEric J. Leonilda Cozby, MD Allergy / Immunology [facility]   ______________________________________________________________________    NEW PATIENT NOTE  Referring Provider: Rhea BleacherBrown-Patram, Melissa J* Primary Provider: Krystal ClarkBrown-Patram, Melissa Joyce, NP Date of office visit: 01/01/2019    Subjective:   Chief Complaint:  Natasha Cole (DOB: August 17, 1970) is a 48 y.o. female who presents to the clinic on 01/01/2019 with a chief complaint of Pruritis (for 8 months) .     HPI: Natasha Cole presents to this clinic in evaluation of itching.  For at least 8 months if not longer she has had global itchiness.  She does not really notice a dermatitis other than the fact that if she excoriates her skin to a large extent it becomes very red and sometimes she will develop thickened blotchy areas.  She itches the back of her neck and her forearms for the most part.  She has no associated systemic or constitutional symptoms.  Specifically, there is no associated respiratory or GI symptoms or fever or night sweats or malaise.  She does have a history of allergic rhinitis with sneezing in the morning or following exposure to pollen but this has been present for decades.  She does have regurgitation and nausea over the course of the past few months which is an intermittent issue.  There is no obvious trigger giving rise to this issue.  She has not really had any new medications or new environmental changes.  She does  not use any supplements or health foods that are new.  She has had a few medications discontinued for the past 4 months but this has not really help this issue.  She has tried various medications which have not really helped very much.  She has tried Benadryl and other antihistamines and most recently hydroxyzine.  She has changed her laundry detergent and her body wash and her moisturizer.  Past Medical History:  Diagnosis Date  . Depression   . Hypothyroidism     Past Surgical History:  Procedure Laterality Date  . ANKLE SURGERY    . CHOLECYSTECTOMY    . gastic bypass  2012  . reversal gastric bypass  2018  . TUBAL LIGATION      Allergies as of 01/01/2019   No Known Allergies     Medication List     busPIRone 30 MG tablet Commonly known as: BUSPAR Take 1 tablet (30 mg total) by mouth 2 (two) times daily.   hydrOXYzine 25 MG tablet Commonly known as: ATARAX/VISTARIL Take 1 tablet by mouth daily.   levothyroxine 50 MCG tablet Commonly known as: SYNTHROID Take by mouth.   lithium carbonate 150 MG capsule Take 4 capsules (600 mg total) by mouth daily.   mirtazapine 45 MG tablet Commonly known as: REMERON Take 1 tablet (45 mg total) by mouth daily. as directed   pramipexole 0.125 MG tablet Commonly known as: MIRAPEX Take 2 tablets (0.25 mg total) by mouth 2 (two) times a day.   rosuvastatin 10 MG tablet Commonly known  as: CRESTOR TAKE 1 TABLET BY MOUTH ONCE DAILY   vitamin E 400 UNIT capsule Take by mouth.       Review of systems negative except as noted in HPI / PMHx or noted below:  Review of Systems  Constitutional: Negative.   HENT: Negative.   Eyes: Negative.   Respiratory: Negative.   Cardiovascular: Negative.   Gastrointestinal: Negative.   Genitourinary: Negative.   Musculoskeletal: Negative.   Skin: Negative.   Neurological: Negative.   Endo/Heme/Allergies: Negative.   Psychiatric/Behavioral: Negative.     Family History  Problem  Relation Age of Onset  . Allergic rhinitis Father   . Hypertension Father   . Stroke Father   . Hypertension Brother   . Hypercholesterolemia Brother     Social History   Socioeconomic History  . Marital status: Divorced    Spouse name: Not on file  . Number of children: Not on file  . Years of education: Not on file  . Highest education level: Not on file  Occupational History  . Not on file  Social Needs  . Financial resource strain: Not on file  . Food insecurity    Worry: Not on file    Inability: Not on file  . Transportation needs    Medical: Not on file    Non-medical: Not on file  Tobacco Use  . Smoking status: Never Smoker  . Smokeless tobacco: Never Used  Substance and Sexual Activity  . Alcohol use: Not Currently  . Drug use: Not Currently  . Sexual activity: Not on file  Lifestyle  . Physical activity    Days per week: Not on file    Minutes per session: Not on file  . Stress: Not on file  Relationships  . Social Musicianconnections    Talks on phone: Not on file    Gets together: Not on file    Attends religious service: Not on file    Active member of club or organization: Not on file    Attends meetings of clubs or organizations: Not on file    Relationship status: Not on file  . Intimate partner violence    Fear of current or ex partner: Not on file    Emotionally abused: Not on file    Physically abused: Not on file    Forced sexual activity: Not on file  Other Topics Concern  . Not on file  Social History Narrative  . Not on file    Environmental and Social history  Lives in a house with a dry environment, a dog located inside the household, no carpet in the bedroom, plastic on the bed, plastic on the pillow, no smokers located to the household.  Objective:   Vitals:   01/01/19 0938  BP: (!) 152/92  Pulse: 76  Resp: 16  Temp: 98 F (36.7 C)  SpO2: 99%   Height: 5' 3.78" (162 cm) Weight: (!) 325 lb (147.4 kg)  Physical Exam  Constitutional:      Appearance: She is not diaphoretic.  HENT:     Head: Normocephalic.     Right Ear: Tympanic membrane, ear canal and external ear normal.     Left Ear: Tympanic membrane, ear canal and external ear normal.     Nose: Nose normal. No mucosal edema or rhinorrhea.     Mouth/Throat:     Pharynx: Uvula midline. No oropharyngeal exudate.  Eyes:     Conjunctiva/sclera: Conjunctivae normal.  Neck:     Thyroid: No  thyromegaly.     Trachea: Trachea normal. No tracheal tenderness or tracheal deviation.  Cardiovascular:     Rate and Rhythm: Normal rate and regular rhythm.     Heart sounds: Normal heart sounds, S1 normal and S2 normal. No murmur.  Pulmonary:     Effort: No respiratory distress.     Breath sounds: Normal breath sounds. No stridor. No wheezing or rales.  Lymphadenopathy:     Head:     Right side of head: No tonsillar adenopathy.     Left side of head: No tonsillar adenopathy.     Cervical: No cervical adenopathy.  Skin:    Findings: No erythema or rash.     Nails: There is no clubbing.   Neurological:     Mental Status: She is alert.     Diagnostics: Allergy skin tests were not performed.   Results of blood tests obtained 22 December 2018 identified WBC 6.9, absolute eosinophil 500, absolute lymphocyte 1600, hemoglobin 13.5, platelet 381, sed rate 11, creatinine 1.20 mg/DL, AST 14 U/L, ALT 12 U/L, negative ANA  Assessment and Plan:    1. Pruritic disorder   2. Inflammatory dermatosis   3. Gastroesophageal reflux disease, esophagitis presence not specified   4. Eosinophilia     1.  Allergen avoidance measures?  2.  Cetirizine 10 mg - 1-2 tablets 1-2 times a day (40 mg max)  3.  Famotidine 20 mg - 1 tablet twice a day  4.  Montelukast 10 mg - 1 tablet once a day  5.  Shower followed by mometasone 0.1% ointment to damage skin daily until healed  6.  Review all blood tests.  Further testing?  7.  Return to clinic in 3 weeks or earlier if  problem  Harley has some form of immunological hyperreactivity manifested as pruritus and occasional inflammation of her skin in the setting of eosinophilia.  We will have her utilize a collection of medications in the hope of minimizing this overactivity and I am going to review all of her blood test that of been performed in the past and consider further evaluation for systemic disease contributing to this immunological hyperreactivity.  I will see her back in this clinic in 3 weeks or earlier if there is a problem.  Jiles Prows, MD Allergy / Immunology [facility]

## 2019-01-02 ENCOUNTER — Other Ambulatory Visit: Payer: Self-pay | Admitting: Physician Assistant

## 2019-01-02 ENCOUNTER — Encounter: Payer: Self-pay | Admitting: Allergy and Immunology

## 2019-01-02 ENCOUNTER — Telehealth: Payer: Self-pay | Admitting: Physician Assistant

## 2019-01-02 ENCOUNTER — Other Ambulatory Visit: Payer: Self-pay

## 2019-01-02 MED ORDER — IMIPRAMINE HCL 10 MG PO TABS: ORAL_TABLET | ORAL | 0 refills | Status: DC

## 2019-01-02 NOTE — Progress Notes (Signed)
Reviewed meds for insomnia

## 2019-01-02 NOTE — Telephone Encounter (Signed)
Pt called to advise not sleeping well for a while now. Talked to PCP was advised to tell you. Ask for Rx . [phone]

## 2019-01-02 NOTE — Telephone Encounter (Signed)
Pt willing to try imipramine again, itching did not resolve so that was not the issue. She doesn't remember if her sleep was affected before or after that.  Instructed her that I would send in new rx and to follow up with no improvements. She agrees.

## 2019-01-02 NOTE — Telephone Encounter (Signed)
Please ask her if this got worse after she stopped the imipramine.  Also, did the itching improve after she went off the imipramine?  I think adding that back in would be helpful.  If she is willing to try it again and needs a prescription let me know.  I will send in imipramine 10 mg #60, 1-2 nightly as needed sleep.  No refills.

## 2019-01-05 ENCOUNTER — Other Ambulatory Visit: Payer: Self-pay | Admitting: *Deleted

## 2019-01-05 ENCOUNTER — Telehealth: Payer: Self-pay | Admitting: Allergy and Immunology

## 2019-01-05 MED ORDER — HYDROXYZINE PAMOATE 25 MG PO CAPS: ORAL_CAPSULE | ORAL | 0 refills | Status: DC

## 2019-01-05 NOTE — Telephone Encounter (Signed)
Natasha Cole called the office and stated she saw Dr. Neldon Mc earlier this week for itching.  Dr. Neldon Mc prescribed her medication and she has followed the plan as stated in her AVS.  Natasha Cole is still having itching and wants to know how long is she supposed to still have itching before it gets better?  Natasha Cole would like to know if there is anything else that can be done to help with the itching.  Please advise.

## 2019-01-05 NOTE — Telephone Encounter (Signed)
She is taking the max dose of Cetirzine (40 mgs daily). She wants to try the Hydroxyzine so I will sent the script to [facility].

## 2019-01-05 NOTE — Telephone Encounter (Signed)
Is she taking the max dose of Cetirizine (40mg  max)?   Please ensure she is moisturizing well after showering.   From the notes it appears itching has been ongoing for 8 months thus I do not have an answer as to how long it may continue to last.  Dr. Neldon Mc may consider additional labwork to evaluate.     If she would like to try hydroxyzine over the weekend to see if she can get some immediate relief of symptoms or to help if sleep is affected she can try hydroxyzine 25mg  at bedtime.

## 2019-01-08 NOTE — Telephone Encounter (Signed)
[employer] called today to let us know that the Hydroxyzine has been approved for one year. Both member and pharmacy have been made aware.

## 2019-01-24 ENCOUNTER — Ambulatory Visit (INDEPENDENT_AMBULATORY_CARE_PROVIDER_SITE_OTHER): Payer: Medicare Other | Admitting: Allergy and Immunology

## 2019-01-24 ENCOUNTER — Encounter: Payer: Self-pay | Admitting: Allergy and Immunology

## 2019-01-24 ENCOUNTER — Other Ambulatory Visit: Payer: Self-pay

## 2019-01-24 VITALS — BP 148/100 | HR 84 | Temp 98.3°F | Resp 14

## 2019-01-24 DIAGNOSIS — L308 Other specified dermatitis: Secondary | ICD-10-CM

## 2019-01-24 DIAGNOSIS — D721 Eosinophilia, unspecified: Secondary | ICD-10-CM

## 2019-01-24 DIAGNOSIS — L299 Pruritus, unspecified: Secondary | ICD-10-CM | POA: Diagnosis not present

## 2019-01-24 DIAGNOSIS — L989 Disorder of the skin and subcutaneous tissue, unspecified: Secondary | ICD-10-CM

## 2019-01-24 DIAGNOSIS — K219 Gastro-esophageal reflux disease without esophagitis: Secondary | ICD-10-CM

## 2019-01-24 NOTE — Progress Notes (Signed)
Natasha Cole returns to this clinic to have skin testing performed.  She presented to this clinic for initial evaluation on 01 January 2019 with a pruritic disorder and inflammatory dermatosis and reflux and eosinophilia.  While utilizing a combination of cetirizine and famotidine and montelukast she is much better and has had a significant decrease in intensity and frequency of itching and has resolved her GI issues.  Allergy skin testing was performed.  She demonstrated significant hypersensitivity against grasses, weeds, and trees.  She will perform allergen avoidance measures as best as possible and continue to use her medical plan established during her last visit and I will see her back in this clinic in 8 weeks which will be a total of 12 weeks of treatment.

## 2019-01-25 ENCOUNTER — Encounter: Payer: Self-pay | Admitting: Allergy and Immunology

## 2019-01-29 ENCOUNTER — Other Ambulatory Visit: Payer: Self-pay | Admitting: Allergy

## 2019-01-29 ENCOUNTER — Other Ambulatory Visit: Payer: Self-pay | Admitting: Physician Assistant

## 2019-01-30 ENCOUNTER — Encounter: Payer: Self-pay | Admitting: *Deleted

## 2019-01-31 ENCOUNTER — Telehealth: Payer: Self-pay | Admitting: Physician Assistant

## 2019-01-31 NOTE — Telephone Encounter (Signed)
Natasha Cole called to request refill of her mirapex. The dose was increased to 2 tabs BID.  Send in script for the new dose.  Next appt 10/27.  Send to [facility] pharmacy

## 2019-02-01 ENCOUNTER — Other Ambulatory Visit: Payer: Self-pay

## 2019-02-01 MED ORDER — PRAMIPEXOLE DIHYDROCHLORIDE 0.125 MG PO TABS: 0.2500 mg | ORAL_TABLET | Freq: Two times a day (BID) | ORAL | 0 refills | Status: DC

## 2019-02-01 NOTE — Telephone Encounter (Signed)
rx for 90 day already submitted to [facility] but will resubmit in case they haven't received.

## 2019-02-27 ENCOUNTER — Other Ambulatory Visit: Payer: Self-pay | Admitting: *Deleted

## 2019-02-27 MED ORDER — HYDROXYZINE PAMOATE 25 MG PO CAPS: ORAL_CAPSULE | ORAL | 3 refills | Status: DC

## 2019-03-21 ENCOUNTER — Ambulatory Visit: Payer: Medicare Other | Admitting: Allergy and Immunology

## 2019-04-02 ENCOUNTER — Other Ambulatory Visit: Payer: Self-pay | Admitting: Physician Assistant

## 2019-04-24 ENCOUNTER — Ambulatory Visit: Payer: Medicare Other | Admitting: Physician Assistant

## 2019-04-30 ENCOUNTER — Other Ambulatory Visit: Payer: Self-pay | Admitting: Physician Assistant

## 2019-05-14 ENCOUNTER — Other Ambulatory Visit: Payer: Self-pay | Admitting: Physician Assistant

## 2019-05-14 ENCOUNTER — Telehealth: Payer: Self-pay | Admitting: *Deleted

## 2019-05-14 ENCOUNTER — Other Ambulatory Visit: Payer: Self-pay

## 2019-05-14 MED ORDER — PREDNISONE 5 MG PO TABS: ORAL_TABLET | ORAL | 0 refills | Status: DC

## 2019-05-14 NOTE — Telephone Encounter (Signed)
Called and asked patient about coming in for appointment.  She is unable to come in this week.  She is not allowed to drive currently, so she has to get transportation.  Would there be anything you could suggest over the phone for her to try this week? Please advise.

## 2019-05-14 NOTE — Telephone Encounter (Signed)
Patient called to let us know that she feels like her medication is no longer working. Patient said she began itching again about four weeks ago. Patient would like a call back for advice.

## 2019-05-14 NOTE — Telephone Encounter (Signed)
Please have her come to clinic on Wednesday or Thursday as it has been 4 months since have seen her in this clinic and we can go over a few issues about further options for evaluation and treatment.

## 2019-05-14 NOTE — Telephone Encounter (Signed)
Called and spoke with patient she stated that her itching is not as bad as it was back in July however for the last 4 weeks patient stated that it is gradually getting worse each week. She stated that she is taking her Zyrtec, Pepcid and montelukast and feels there are no longer working. Patient is wondering what else she can do for her itching. Please advise.

## 2019-05-14 NOTE — Telephone Encounter (Signed)
Please make sure she is using the following agents:   2.  Cetirizine 10 mg - 1-2 tablets 1-2 times a day (40 mg max)  3.  Famotidine 20 mg - 1 tablet twice a day  4.  Montelukast 10 mg - 1 tablet once a day  We can use prednisone 10 mg tablet-1/2 tablet 1 time per day for 7 days only

## 2019-05-14 NOTE — Telephone Encounter (Signed)
Called and informed patient of Dr. Bruna Potter message.  She is using medications as Dr. Neldon Mc directed.  I sent in [place] for Prednisone 5 mg- one tablet once daily for seven days to [facility] as requested.

## 2019-05-16 ENCOUNTER — Encounter: Payer: Self-pay | Admitting: Physician Assistant

## 2019-05-16 ENCOUNTER — Other Ambulatory Visit: Payer: Self-pay

## 2019-05-16 ENCOUNTER — Ambulatory Visit (INDEPENDENT_AMBULATORY_CARE_PROVIDER_SITE_OTHER): Payer: Medicare Other | Admitting: Physician Assistant

## 2019-05-16 DIAGNOSIS — F3341 Major depressive disorder, recurrent, in partial remission: Secondary | ICD-10-CM | POA: Diagnosis not present

## 2019-05-16 DIAGNOSIS — Z79899 Other long term (current) drug therapy: Secondary | ICD-10-CM

## 2019-05-16 DIAGNOSIS — F411 Generalized anxiety disorder: Secondary | ICD-10-CM

## 2019-05-16 MED ORDER — PRAMIPEXOLE DIHYDROCHLORIDE 0.125 MG PO TABS: 0.2500 mg | ORAL_TABLET | Freq: Two times a day (BID) | ORAL | 1 refills | Status: DC

## 2019-05-16 NOTE — Progress Notes (Signed)
[facility] Med Check  Patient ID: Natasha Cole,  MRN: [id number]  PCP: Krystal Clark, NP   Date of Evaluation: 05/16/2019 Time spent:15 minutes  Chief Complaint:  Chief Complaint    Follow-up     Virtual Visit via Telephone Note  I connected with patient by a video enabled telemedicine application or telephone, with their informed consent, and verified patient privacy and that I am speaking with the correct person using two identifiers.  I am private, in my office and the patient is home.   I discussed the limitations, risks, security and privacy concerns of performing an evaluation and management service by telephone and the availability of in person appointments. I also discussed with the patient that there may be a patient responsible charge related to this service. The patient expressed understanding and agreed to proceed.   I discussed the assessment and treatment plan with the patient. The patient was provided an opportunity to ask questions and all were answered. The patient agreed with the plan and demonstrated an understanding of the instructions.   The patient was advised to call back or seek an in-person evaluation if the symptoms worsen or if the condition fails to improve as anticipated.  I provided 15 minutes of non-face-to-face time during this encounter.  HISTORY/CURRENT STATUS: HPI for routine med check. Former pt of [employer], [place].  Overall she states she is doing well.  Her mood is good. Patient denies loss of interest in usual activities and is able to enjoy things.  Denies decreased energy or motivation.  Appetite has not changed.  No extreme sadness, tearfulness, or feelings of hopelessness.  Denies any changes in concentration, making decisions or remembering things.  Denies suicidal or homicidal thoughts.  She sleeps pretty well and the pramipexole helps with the jerking and weird movements in her legs.  Well controlled with current  treatment.  No panic attacks.  At the last visit, she reported severe itching as well as a mild rash.  It started after imipramine was started.  We weaned her off that and she states the itching went away.  She also sees her PCP for that.  She has had allergy testing and has been found to be allergic to "everything in [place]."  She does sometimes have itching which she believes is related to external factors.  States she is much better since being off of the imipramine and she has had no problems sleeping or mood changes since going off of it.  Denies muscle or joint pain, stiffness, or dystonia. Denies dizziness, syncope, seizures, numbness, tingling, tremor, tics, unsteady gait, slurred speech, confusion.   Individual Medical History/ Review of Systems: Changes? :No    Past medications for mental health diagnoses include: Lithium, Seroquel, Latuda, Abilify, Remeron, BuSpar, Effexor, Deplin, Rexulti, Risperdal, Cymbalta, Prozac, Pristiq, Lamictal, Wellbutrin, trazodone, Viibryd, Zoloft, Mirapex, Klonopin, imipramine caused rash and itching  Allergies: Imipramine  Current Medications:  Current Outpatient Medications:  .  busPIRone (BUSPAR) 30 MG tablet, TAKE 1 TABLET BY MOUTH TWICE DAILY, Disp: 180 tablet, Rfl: 0 .  famotidine (PEPCID) 20 MG tablet, Take 1 tablet (20 mg total) by mouth 2 (two) times daily., Disp: 60 tablet, Rfl: 5 .  hydrOXYzine (VISTARIL) 25 MG capsule, TAKE 1 CAPSULE BY MOUTH EACH NIGHT AT BEDTIME FOR ITCHING, Disp: 30 capsule, Rfl: 3 .  levothyroxine (SYNTHROID, LEVOTHROID) 50 MCG tablet, Take by mouth., Disp: , Rfl:  .  lithium carbonate 150 MG capsule, TAKE 4 CAPSULES BY MOUTH ONCE  DAILY AS DIRECTED, Disp: 360 capsule, Rfl: 1 .  mirtazapine (REMERON) 45 MG tablet, Take 1 tablet (45 mg total) by mouth daily. as directed, Disp: 90 tablet, Rfl: 1 .  montelukast (SINGULAIR) 10 MG tablet, Take 1 tablet (10 mg total) by mouth at bedtime., Disp: 30 tablet, Rfl: 5 .   pramipexole (MIRAPEX) 0.125 MG tablet, Take 2 tablets (0.25 mg total) by mouth 2 (two) times daily., Disp: 360 tablet, Rfl: 1 .  predniSONE (DELTASONE) 5 MG tablet, Take one tablet by mouth once daily for seven days total., Disp: 7 tablet, Rfl: 0 .  rosuvastatin (CRESTOR) 10 MG tablet, TAKE 1 TABLET BY MOUTH ONCE DAILY, Disp: , Rfl:  .  vitamin E 400 UNIT capsule, Take by mouth., Disp: , Rfl:  .  imipramine (TOFRANIL) 10 MG tablet, TAKE 1-2 TABLETS BY MOUTH EVERY NIGHT FOR SLEEP, Disp: 60 tablet, Rfl: 1 .  mometasone (ELOCON) 0.1 % ointment, Apply topically daily. (Patient not taking: Reported on 05/16/2019), Disp: 45 g, Rfl: 5 Medication Side Effects: none  Family Medical/ Social History: Changes? No  MENTAL HEALTH EXAM:  There were no vitals taken for this visit.There is no height or weight on file to calculate BMI.  General Appearance: unable to assess  Eye Contact:  unable to assess  Speech:  Clear and Coherent  Volume:  Normal  Mood:  Euthymic  Affect:  unable to assess  Thought Process:  Goal Directed and Descriptions of Associations: Intact  Orientation:  Full (Time, Place, and Person)  Thought Content: Logical   Suicidal Thoughts:  No  Homicidal Thoughts:  No  Memory:  WNL  Judgement:  Good  Insight:  Good  Psychomotor Activity:  unable to assess  Concentration:  Concentration: Good  Recall:  Good  Fund of Knowledge: Good  Language: Good  Assets:  Desire for Improvement  ADL's:  Intact  Cognition: WNL  Prognosis:  Good    DIAGNOSES:    ICD-10-CM   1. Recurrent major depressive disorder, in partial remission ([place])  F33.41   2. Encounter for long-term (current) use of medications  Z79.899 Lithium level    Basic metabolic panel  3. Generalized anxiety disorder  F41.1     Receiving Psychotherapy: No    RECOMMENDATIONS:  I am glad to see her doing so well! Continue BuSpar 30 mg 1 twice daily. Continue lithium 600 mg daily. Continue Remeron 45 mg  nightly Continue pramipexole 0.125 mg, 2 nightly. Obtain labs as above in December or January. Return in 6 months or sooner as needed.  Donnal Moat, PA-C

## 2019-05-18 ENCOUNTER — Telehealth: Payer: Self-pay

## 2019-05-18 NOTE — Telephone Encounter (Signed)
Patient called to inform Dr. Neldon Mc that the prednisone has most definitely helped the itching. She wants to know if she will stay on the prednisone or if another prescription would be called in to help the itching stay at [facility].  I informed her that Dr. Neldon Mc would not be in office until Monday and she voiced understanding.

## 2019-05-21 NOTE — Telephone Encounter (Signed)
Patient informed that we do not tend to use prednisone as a preventative. Informed her about xolair and she was not interested at this time. She was wondering if we could send something else in to her pharmacy for itching. I asked her if the hydroxyzine that we sent in (in July) was helpful and she states she does not remember, that she would contact her pharmacy.

## 2019-05-21 NOTE — Telephone Encounter (Signed)
Please inform patient that we would rather not to use prednisone on a long-term basis because of its long-term side effects.  We only use this agent very short-term.  She may need to start omalizumab if she continues to have problems.  She did have documented eosinophilia in the past and we need to follow that up with a CBC with differential.

## 2019-06-04 ENCOUNTER — Other Ambulatory Visit: Payer: Self-pay | Admitting: Physician Assistant

## 2019-06-25 ENCOUNTER — Other Ambulatory Visit: Payer: Self-pay | Admitting: Allergy and Immunology

## 2019-06-26 ENCOUNTER — Other Ambulatory Visit: Payer: Self-pay | Admitting: Allergy and Immunology

## 2019-07-30 ENCOUNTER — Other Ambulatory Visit: Payer: Self-pay | Admitting: Physician Assistant

## 2019-07-30 ENCOUNTER — Telehealth: Payer: Self-pay | Admitting: Physician Assistant

## 2019-07-30 NOTE — Telephone Encounter (Signed)
Received a refill request for same medications from [facility] but will decline those and submit to [facility] per request both for 90 day supply

## 2019-07-30 NOTE — Telephone Encounter (Signed)
Patient called and said that she needs a refill on her impramine 10 mg and buspar 30 mg to be sent to the [facility]

## 2019-08-15 ENCOUNTER — Other Ambulatory Visit: Payer: Self-pay | Admitting: Physician Assistant

## 2019-08-22 ENCOUNTER — Other Ambulatory Visit: Payer: Self-pay | Admitting: Physician Assistant

## 2019-08-30 ENCOUNTER — Other Ambulatory Visit: Payer: Self-pay | Admitting: Physician Assistant

## 2019-09-12 ENCOUNTER — Other Ambulatory Visit: Payer: Self-pay

## 2019-09-12 ENCOUNTER — Telehealth: Payer: Self-pay | Admitting: Physician Assistant

## 2019-09-12 MED ORDER — BUSPIRONE HCL 30 MG PO TABS: 30.0000 mg | ORAL_TABLET | Freq: Two times a day (BID) | ORAL | 0 refills | Status: DC

## 2019-09-12 MED ORDER — LITHIUM CARBONATE 150 MG PO CAPS: ORAL_CAPSULE | ORAL | 0 refills | Status: DC

## 2019-09-12 NOTE — Telephone Encounter (Signed)
Updated patient's pharmacy [facility] now, patient called last month changing pharmacy to [facility].   Rx for Lithium and Buspar [facility] to [facility]

## 2019-09-12 NOTE — Telephone Encounter (Signed)
Pt would like a new rx on Lithium. Pt is changing pharmacies. [facility] [place] on [address]

## 2019-09-17 ENCOUNTER — Other Ambulatory Visit: Payer: Self-pay | Admitting: Allergy and Immunology

## 2019-09-26 ENCOUNTER — Other Ambulatory Visit: Payer: Self-pay | Admitting: Allergy and Immunology

## 2019-09-26 ENCOUNTER — Telehealth: Payer: Self-pay | Admitting: *Deleted

## 2019-09-26 NOTE — Telephone Encounter (Signed)
Appt scheduled for tomorrow.  °

## 2019-09-26 NOTE — Telephone Encounter (Signed)
Natasha Cole calls to report that she is still itching and the severity is about the same as it was. She is still taking the following:  Cetirizine 10mg  - 2 tablets twice daily  Famotidine 20mg  - 1 tablet twice daily  Montelukast 10mg  - 1 tablet once daily    Please advise.

## 2019-09-26 NOTE — Telephone Encounter (Signed)
Please have her come into clinic for an OV.  Her last visit to this clinic was July 2020.

## 2019-09-27 ENCOUNTER — Encounter: Payer: Self-pay | Admitting: Allergy and Immunology

## 2019-09-27 ENCOUNTER — Other Ambulatory Visit: Payer: Self-pay

## 2019-09-27 ENCOUNTER — Telehealth: Payer: Self-pay | Admitting: *Deleted

## 2019-09-27 ENCOUNTER — Ambulatory Visit (INDEPENDENT_AMBULATORY_CARE_PROVIDER_SITE_OTHER): Payer: Medicare Other | Admitting: Allergy and Immunology

## 2019-09-27 VITALS — BP 134/100 | HR 80 | Temp 97.9°F | Resp 18 | Ht 64.0 in | Wt 328.2 lb

## 2019-09-27 DIAGNOSIS — L308 Other specified dermatitis: Secondary | ICD-10-CM | POA: Diagnosis not present

## 2019-09-27 DIAGNOSIS — D7219 Other eosinophilia: Secondary | ICD-10-CM

## 2019-09-27 DIAGNOSIS — L501 Idiopathic urticaria: Secondary | ICD-10-CM | POA: Diagnosis not present

## 2019-09-27 DIAGNOSIS — L299 Pruritus, unspecified: Secondary | ICD-10-CM | POA: Diagnosis not present

## 2019-09-27 DIAGNOSIS — L989 Disorder of the skin and subcutaneous tissue, unspecified: Secondary | ICD-10-CM

## 2019-09-27 MED ORDER — EPINEPHRINE 0.3 MG/0.3ML IJ SOAJ: 0.3000 mg | INTRAMUSCULAR | 3 refills | Status: DC | PRN

## 2019-09-27 MED ORDER — OMALIZUMAB 150 MG ~~LOC~~ SOLR
300.0000 mg | SUBCUTANEOUS | Status: AC
  Administered 2019-09-27 – 2020-04-17 (×7): 300 mg via SUBCUTANEOUS

## 2019-09-27 NOTE — Patient Instructions (Addendum)
  1.  Continue the following:   A. Cetirizine 10 mg - 1-2 tablets 1-2 times a day (40 mg max)  B. Famotidine 20 mg - 1 tablet twice a day  C. Montelukast 10 mg - 1 tablet once a day  2. Omalizumab today and every 4 weeks  3.  Blood - CBC w/D, CMP, TSH, FT4, TP  4. Can add hydroxyzine if needed  5. Imipramine?  6. Obtain Covid vaccine

## 2019-09-27 NOTE — Progress Notes (Signed)
[place] [facility] [place] [facility] [place]   Follow-up Note  Referring Provider: Rhea Bleacher* Primary Provider: Krystal Clark, NP Date of Office Visit: 09/27/2019  Subjective:   Natasha Cole (DOB: 12-03-1970) is a 49 y.o. 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  I last saw her in this clinic on 01 January 2019 which was her initial evaluation.  She did well while utilizing a large collection of medical therapy including an H1 and H2 receptor blocker and a leukotriene modifier.  Her pruritus and her dermatitis was tolerable during that timeframe.  However, in January 2021 she started to lose control of this issue and now she is globally pruritic and she has noticed that she has had red spots show up on her trunk.  She has no associated systemic or constitutional symptoms.  Once again there does not appear to be a obvious provoking factor giving rise to this issue.  Allergies as of 09/27/2019      Reactions   Imipramine Itching      Medication List      busPIRone 30 MG tablet Commonly known as: BUSPAR Take 1 tablet (30 mg total) by mouth 2 (two) times daily.   famotidine 20 MG tablet Commonly known as: PEPCID TAKE 1 TABLET BY MOUTH TWICE DAILY   hydrOXYzine 25 MG capsule Commonly known as: VISTARIL TAKE 1 CAPSULE BY MOUTH EVERY DAY AT BEDTIME   imipramine 10 MG tablet Commonly known as: TOFRANIL TAKE 1 OR 2 TABLETS BY MOUTH EACH NIGHT AT BEDTIME FOR SLEEP   levothyroxine 50 MCG tablet Commonly known as: SYNTHROID Take by mouth.   lithium carbonate 150 MG capsule TAKE 4 CAPSULES BY MOUTH ONCE DAILY AS DIRECTED   mirtazapine 45 MG tablet Commonly known as: REMERON TAKE 1 TABLET BY MOUTH ONCE DAILY AS DIRECTED   mometasone 0.1 %  ointment Commonly known as: ELOCON Apply topically daily.   montelukast 10 MG tablet Commonly known as: SINGULAIR TAKE 1 TABLET BY MOUTH EVERY DAY   pramipexole 0.125 MG tablet Commonly known as: MIRAPEX Take 2 tablets (0.25 mg total) by mouth 2 (two) times daily.   rosuvastatin 10 MG tablet Commonly known as: CRESTOR TAKE 1 TABLET BY MOUTH ONCE DAILY   vitamin E 180 MG (400 UNITS) capsule Take by mouth.       Past Medical History:  Diagnosis Date  . Depression   . Hypothyroidism     Past Surgical History:  Procedure Laterality Date  . ANKLE SURGERY    . CHOLECYSTECTOMY    . gastic bypass  2012  . reversal gastric bypass  2018  . TUBAL LIGATION      Review of systems negative except as noted in HPI / PMHx or noted below:  Review of Systems  Constitutional: Negative.   HENT: Negative.   Eyes: Negative.   Respiratory: Negative.   Cardiovascular: Negative.   Gastrointestinal: Negative.   Genitourinary: Negative.   Musculoskeletal: Negative.   Skin: Negative.   Neurological: Negative.   Endo/Heme/Allergies: Negative.   Psychiatric/Behavioral: Negative.      Objective:   Vitals:   09/27/19 1003  BP: (!) 134/100  Pulse: 80  Resp: 18  Temp: 97.9 F (36.6 C)  SpO2: 99%   Height: 5\' 4"  (162.6 cm)  Weight: (!) 328  lb 3.2 oz (148.9 kg)   Physical Exam Constitutional:      Appearance: She is not diaphoretic.  HENT:     Head: Normocephalic.     Right Ear: Tympanic membrane, ear canal and external ear normal.     Left Ear: Tympanic membrane, ear canal and external ear normal.     Nose: Nose normal. No mucosal edema or rhinorrhea.     Mouth/Throat:     Pharynx: Uvula midline. No oropharyngeal exudate.  Eyes:     Conjunctiva/sclera: Conjunctivae normal.  Neck:     Thyroid: No thyromegaly.     Trachea: Trachea normal. No tracheal tenderness or tracheal deviation.  Cardiovascular:     Rate and Rhythm: Normal rate and regular rhythm.     Heart  sounds: Normal heart sounds, S1 normal and S2 normal. No murmur.  Pulmonary:     Effort: No respiratory distress.     Breath sounds: Normal breath sounds. No stridor. No wheezing or rales.  Lymphadenopathy:     Head:     Right side of head: No tonsillar adenopathy.     Left side of head: No tonsillar adenopathy.     Cervical: No cervical adenopathy.  Skin:    Findings: No erythema or rash (Blanching erythematous slightly circumferential lesions affecting anterior chest.).     Nails: There is no clubbing.  Neurological:     Mental Status: She is alert.     Diagnostics: none  Assessment and Plan:   1. Pruritic disorder   2. Inflammatory dermatosis   3. Idiopathic urticaria   4. Other eosinophilia     1.  Continue the following:   A. Cetirizine 10 mg - 1-2 tablets 1-2 times a day (40 mg max)  B. Famotidine 20 mg - 1 tablet twice a day  C. Montelukast 10 mg - 1 tablet once a day  2. Omalizumab today and every 4 weeks  3.  Blood - CBC w/D, CMP, TSH, FT4, TP  4. Can add hydroxyzine if needed  5. Imipramine?  6. Obtain Covid vaccine  Natasha Cole appears to have an overactive immune system for some unknown reason that has not responded well to medical therapy and will now start her on omalizumab and have her undergo further investigation for systemic disease contributing to her immunological hyperreactivity.  She does have a history in her electronic medical record of having itchiness secondary to imipramine use but she cannot remember the details of this issue.  She will need to discuss with the physician who prescribed this medication about tapering off to truly see if imipramine is responsible for some of her pruritus.  Natasha Katz, MD Allergy / Immunology [place]

## 2019-09-27 NOTE — Telephone Encounter (Signed)
Called and discussed Xolair with patient. Due to her [facility] advantage plan we would need to go thru [employer] patient assistance for free drug.  Will mail app to patient

## 2019-09-27 NOTE — Progress Notes (Signed)
Patient received sample of Xolair while in office. Patient educated on possible side effects and was given education materials on medication. Consent was signed and epi pen teach was done and prescription was sent to pharmacy. No reactions occurred while in office during wait period.

## 2019-09-28 LAB — COMPREHENSIVE METABOLIC PANEL
ALT: 13 IU/L (ref 0–32)
AST: 13 IU/L (ref 0–40)
Albumin/Globulin Ratio: 2.5 — ABNORMAL HIGH (ref 1.2–2.2)
Albumin: 4.7 g/dL (ref 3.8–4.8)
Alkaline Phosphatase: 90 IU/L (ref 39–117)
BUN/Creatinine Ratio: 9 (ref 9–23)
BUN: 12 mg/dL (ref 6–24)
Bilirubin Total: 0.3 mg/dL (ref 0.0–1.2)
CO2: 26 mmol/L (ref 20–29)
Calcium: 9.4 mg/dL (ref 8.7–10.2)
Chloride: 102 mmol/L (ref 96–106)
Creatinine, Ser: 1.32 mg/dL — ABNORMAL HIGH (ref 0.57–1.00)
GFR calc Af Amer: 55 mL/min/{1.73_m2} — ABNORMAL LOW (ref >59)
GFR calc non Af Amer: 47 mL/min/{1.73_m2} — ABNORMAL LOW (ref >59)
Globulin, Total: 1.9 g/dL (ref 1.5–4.5)
Glucose: 105 mg/dL — ABNORMAL HIGH (ref 65–99)
Potassium: 4.7 mmol/L (ref 3.5–5.2)
Sodium: 139 mmol/L (ref 134–144)
Total Protein: 6.6 g/dL (ref 6.0–8.5)

## 2019-09-28 LAB — CBC WITH DIFFERENTIAL
Basophils Absolute: 0.1 10*3/uL (ref 0.0–0.2)
Basos: 1 %
EOS (ABSOLUTE): 0.5 10*3/uL — ABNORMAL HIGH (ref 0.0–0.4)
Eos: 6 %
Hematocrit: 43.2 % (ref 34.0–46.6)
Hemoglobin: 14.2 g/dL (ref 11.1–15.9)
Immature Grans (Abs): 0 10*3/uL (ref 0.0–0.1)
Immature Granulocytes: 0 %
Lymphocytes Absolute: 2.3 10*3/uL (ref 0.7–3.1)
Lymphs: 26 %
MCH: 27.8 pg (ref 26.6–33.0)
MCHC: 32.9 g/dL (ref 31.5–35.7)
MCV: 85 fL (ref 79–97)
Monocytes Absolute: 0.8 10*3/uL (ref 0.1–0.9)
Monocytes: 9 %
Neutrophils Absolute: 5.2 10*3/uL (ref 1.4–7.0)
Neutrophils: 58 %
RBC: 5.11 x10E6/uL (ref 3.77–5.28)
RDW: 13.1 % (ref 11.7–15.4)
WBC: 8.9 10*3/uL (ref 3.4–10.8)

## 2019-09-28 LAB — TSH+FREE T4
Free T4: 1.07 ng/dL (ref 0.82–1.77)
TSH: 6.38 u[IU]/mL — ABNORMAL HIGH (ref 0.450–4.500)

## 2019-09-28 LAB — THYROID PEROXIDASE ANTIBODY: Thyroperoxidase Ab SerPl-aCnc: <9 IU/mL (ref 0–34)

## 2019-10-01 ENCOUNTER — Other Ambulatory Visit: Payer: Self-pay | Admitting: *Deleted

## 2019-10-01 ENCOUNTER — Encounter: Payer: Self-pay | Admitting: Allergy and Immunology

## 2019-10-01 ENCOUNTER — Telehealth: Payer: Self-pay | Admitting: Allergy and Immunology

## 2019-10-01 ENCOUNTER — Telehealth: Payer: Self-pay | Admitting: Physician Assistant

## 2019-10-01 MED ORDER — FAMOTIDINE 20 MG PO TABS: ORAL_TABLET | ORAL | 1 refills | Status: DC

## 2019-10-01 MED ORDER — MONTELUKAST SODIUM 10 MG PO TABS: ORAL_TABLET | ORAL | 1 refills | Status: DC

## 2019-10-01 MED ORDER — EPINEPHRINE 0.3 MG/0.3ML IJ SOAJ: INTRAMUSCULAR | 3 refills | Status: AC

## 2019-10-01 MED ORDER — HYDROXYZINE PAMOATE 25 MG PO CAPS: ORAL_CAPSULE | ORAL | 1 refills | Status: DC

## 2019-10-01 NOTE — Telephone Encounter (Signed)
Tried to reach patient to see if she needs refills now but mail box is full. Patient has changed pharmacies multiple times last few weeks unsure if she needs medication at this time.

## 2019-10-01 NOTE — Telephone Encounter (Signed)
RX sent

## 2019-10-01 NOTE — Telephone Encounter (Signed)
Pt updating pharmacy due to insurance. Fill prescriptions at [facility] [phone], fax [phone].

## 2019-10-01 NOTE — Telephone Encounter (Signed)
Kelsey called in and stated that her insurance changed and all her medications need to be sent to [facility].  Ijeoma would like all of the medications Dr. Lucie Leather prescribed sent to [facility] please.

## 2019-10-02 ENCOUNTER — Telehealth: Payer: Self-pay | Admitting: Physician Assistant

## 2019-10-02 ENCOUNTER — Other Ambulatory Visit: Payer: Self-pay

## 2019-10-02 ENCOUNTER — Other Ambulatory Visit: Payer: Self-pay | Admitting: Physician Assistant

## 2019-10-02 MED ORDER — LITHIUM CARBONATE 150 MG PO CAPS: ORAL_CAPSULE | ORAL | 0 refills | Status: DC

## 2019-10-02 MED ORDER — IMIPRAMINE HCL 10 MG PO TABS: ORAL_TABLET | ORAL | 0 refills | Status: DC

## 2019-10-02 MED ORDER — PRAMIPEXOLE DIHYDROCHLORIDE 0.125 MG PO TABS: 0.2500 mg | ORAL_TABLET | Freq: Two times a day (BID) | ORAL | 0 refills | Status: DC

## 2019-10-02 MED ORDER — BUSPIRONE HCL 30 MG PO TABS: 30.0000 mg | ORAL_TABLET | Freq: Two times a day (BID) | ORAL | 0 refills | Status: DC

## 2019-10-02 MED ORDER — MIRTAZAPINE 45 MG PO TABS: 45.0000 mg | ORAL_TABLET | Freq: Every day | ORAL | 0 refills | Status: DC

## 2019-10-02 NOTE — Telephone Encounter (Signed)
Resubmitted

## 2019-10-02 NOTE — Telephone Encounter (Signed)
Pt called and stated the pharmacy she requested her medications to be sent to has not yet been updated. I explained to her that we got her message yesterday and it was sent to the nurse. Pt also stated she needed refills sent in to the pharmacy on all 5 of medications. Please send to [facility]. Fax# [phone].

## 2019-10-02 NOTE — Telephone Encounter (Signed)
The pharmacy didn't receive the lithium refill so can you resend it again

## 2019-10-02 NOTE — Telephone Encounter (Signed)
Pt called to say that her allergist thinks that Tofranil may be causing the "itching" side effect she is having. He suggest she go off of the Tofranil and switch to something else.

## 2019-10-02 NOTE — Telephone Encounter (Signed)
RX's submitted to [facility], patient due back for apt in May 2021

## 2019-10-02 NOTE — Telephone Encounter (Signed)
Have her wean off the Tofranil 10 mg by only taking 1 pill qhs for 4 nights and then stop. Has she ever taken Hydroxyzine, or Ambien or Lunesta?

## 2019-10-02 NOTE — Telephone Encounter (Signed)
We tried reaching patient yesterday but her mailbox was FULL to clarify her multiple changes in pharmacies the last few weeks and if she needed any refills.

## 2019-10-02 NOTE — Telephone Encounter (Signed)
Change in pharmacy

## 2019-10-03 ENCOUNTER — Other Ambulatory Visit: Payer: Self-pay | Admitting: Physician Assistant

## 2019-10-03 MED ORDER — HYDROXYZINE HCL 25 MG PO TABS: 12.5000 mg | ORAL_TABLET | Freq: Three times a day (TID) | ORAL | 0 refills | Status: DC | PRN

## 2019-10-03 NOTE — Telephone Encounter (Signed)
Patient notified

## 2019-10-03 NOTE — Telephone Encounter (Signed)
I sent in Hydroxyzine 25 mg, take 1/2-2 tid for anxiety or sleep.

## 2019-10-04 ENCOUNTER — Telehealth: Payer: Self-pay | Admitting: Physician Assistant

## 2019-10-04 NOTE — Telephone Encounter (Signed)
See prev msg.  I saw that she had hydroxyzine prescribed, but she told you she didn't recognize that name, and wanted to use it. I sent in the Rx b/c she can take it more frequently for psychiatric purposes than her allergist had prescribed.  It looks like he had prescribed it only a few days prior to the prescription I sent in.  So please clarify with her that she only needs this drug for sleep or anxiety or both.  If it is only for sleep and she has not tried it long enough, I recommend she continue to try it for another few nights. from my perspective she can take up to 50 mg p.o. nightly.  There is no reason to add a controlled substance sleep medicine if this will work just as well.

## 2019-10-04 NOTE — Telephone Encounter (Signed)
Patient given information and will take 50 mg at hs of her hydroxyzine to help with sleep. Advised to call back next week if still having trouble sleeping. She agreed. Informed her she can still take a lower dose during the day for anxiety.

## 2019-10-04 NOTE — Telephone Encounter (Signed)
Patient left a message stating that she had spoken to traci and that the hydroxyzine you prescribed yesterday she already takes that same medicine and dose which is prescribed by allergist.She would like to try a different medication. Please call her at [phone]

## 2019-10-15 ENCOUNTER — Telehealth: Payer: Self-pay | Admitting: Physician Assistant

## 2019-10-15 NOTE — Telephone Encounter (Signed)
Pharmacy called needing clarification on refill order for Hydroxyzine. Return call @ [phone]

## 2019-10-15 NOTE — Telephone Encounter (Signed)
Clarification if request was for tablet or capsule, it is a tablet.

## 2019-11-12 ENCOUNTER — Other Ambulatory Visit: Payer: Self-pay | Admitting: Physician Assistant

## 2019-11-22 ENCOUNTER — Ambulatory Visit (INDEPENDENT_AMBULATORY_CARE_PROVIDER_SITE_OTHER): Payer: Medicare Other | Admitting: *Deleted

## 2019-11-22 ENCOUNTER — Other Ambulatory Visit: Payer: Self-pay

## 2019-11-22 DIAGNOSIS — L501 Idiopathic urticaria: Secondary | ICD-10-CM | POA: Diagnosis not present

## 2019-12-17 ENCOUNTER — Other Ambulatory Visit: Payer: Self-pay | Admitting: Physician Assistant

## 2019-12-17 ENCOUNTER — Other Ambulatory Visit: Payer: Self-pay | Admitting: *Deleted

## 2019-12-17 ENCOUNTER — Telehealth: Payer: Self-pay | Admitting: Allergy and Immunology

## 2019-12-17 MED ORDER — MONTELUKAST SODIUM 10 MG PO TABS: ORAL_TABLET | ORAL | 1 refills | Status: DC

## 2019-12-17 NOTE — Telephone Encounter (Signed)
Rx SENT

## 2019-12-17 NOTE — Telephone Encounter (Signed)
Tongela would like Singulair called in to [facility].

## 2019-12-20 ENCOUNTER — Ambulatory Visit: Payer: Medicare Other

## 2019-12-25 ENCOUNTER — Other Ambulatory Visit: Payer: Self-pay

## 2019-12-25 ENCOUNTER — Ambulatory Visit (INDEPENDENT_AMBULATORY_CARE_PROVIDER_SITE_OTHER): Payer: Medicare Other | Admitting: *Deleted

## 2019-12-25 DIAGNOSIS — L501 Idiopathic urticaria: Secondary | ICD-10-CM | POA: Diagnosis not present

## 2020-01-21 ENCOUNTER — Other Ambulatory Visit: Payer: Self-pay

## 2020-01-21 ENCOUNTER — Other Ambulatory Visit: Payer: Self-pay | Admitting: Physician Assistant

## 2020-01-21 ENCOUNTER — Ambulatory Visit (INDEPENDENT_AMBULATORY_CARE_PROVIDER_SITE_OTHER): Payer: Medicare Other | Admitting: *Deleted

## 2020-01-21 DIAGNOSIS — L501 Idiopathic urticaria: Secondary | ICD-10-CM | POA: Diagnosis not present

## 2020-01-21 NOTE — Telephone Encounter (Signed)
Hasn't been in since 04/2019

## 2020-01-22 ENCOUNTER — Ambulatory Visit: Payer: Medicare Other

## 2020-01-28 ENCOUNTER — Other Ambulatory Visit: Payer: Self-pay | Admitting: Physician Assistant

## 2020-01-29 ENCOUNTER — Telehealth: Payer: Self-pay

## 2020-01-29 NOTE — Telephone Encounter (Signed)
Prior authorization submitted and approved through cover my meds for HYDROXYZINE 25 MG TABLETS #60 effective 01/28/2020-01/27/2021 with [employer] of [facility] Medicare Part D.

## 2020-01-30 NOTE — Telephone Encounter (Signed)
Does she need apt? Last visit 04/2019

## 2020-02-18 ENCOUNTER — Ambulatory Visit (INDEPENDENT_AMBULATORY_CARE_PROVIDER_SITE_OTHER): Payer: Medicare Other | Admitting: *Deleted

## 2020-02-18 ENCOUNTER — Other Ambulatory Visit: Payer: Self-pay

## 2020-02-18 DIAGNOSIS — L501 Idiopathic urticaria: Secondary | ICD-10-CM | POA: Diagnosis not present

## 2020-02-19 ENCOUNTER — Other Ambulatory Visit: Payer: Self-pay | Admitting: Physician Assistant

## 2020-02-25 ENCOUNTER — Other Ambulatory Visit: Payer: Self-pay | Admitting: Physician Assistant

## 2020-02-27 ENCOUNTER — Telehealth: Payer: Self-pay | Admitting: Physician Assistant

## 2020-02-27 ENCOUNTER — Other Ambulatory Visit: Payer: Self-pay

## 2020-02-27 MED ORDER — HYDROXYZINE HCL 25 MG PO TABS: ORAL_TABLET | ORAL | 0 refills | Status: DC

## 2020-02-27 MED ORDER — BUSPIRONE HCL 30 MG PO TABS: ORAL_TABLET | ORAL | 0 refills | Status: DC

## 2020-02-27 MED ORDER — MIRTAZAPINE 45 MG PO TABS: ORAL_TABLET | ORAL | 0 refills | Status: DC

## 2020-02-27 MED ORDER — LITHIUM CARBONATE 150 MG PO CAPS: ORAL_CAPSULE | ORAL | 0 refills | Status: DC

## 2020-02-27 MED ORDER — PRAMIPEXOLE DIHYDROCHLORIDE 0.125 MG PO TABS
0.2500 mg | ORAL_TABLET | Freq: Two times a day (BID) | ORAL | 0 refills | Status: DC
Start: 2020-02-27 — End: 2020-03-18

## 2020-02-27 NOTE — Telephone Encounter (Signed)
Refills sent to [facility] as requested

## 2020-02-27 NOTE — Telephone Encounter (Signed)
Last apt was 04/2019, okay to send refills?

## 2020-02-27 NOTE — Telephone Encounter (Signed)
Yes ok 

## 2020-02-27 NOTE — Telephone Encounter (Signed)
Natasha Cole called because she was told she needed an appt to get her refills on her medications.  She is scheduled for 10/6.  Please approve refills of her medications.

## 2020-02-28 ENCOUNTER — Telehealth: Payer: Self-pay | Admitting: Allergy and Immunology

## 2020-02-28 MED ORDER — PREDNISONE 10 MG PO TABS: ORAL_TABLET | ORAL | 0 refills | Status: DC

## 2020-02-28 NOTE — Telephone Encounter (Signed)
Please have Aerica use Prednisone 10 mg - 1 tablet 1 time per day for 10 days.

## 2020-02-28 NOTE — Telephone Encounter (Signed)
Confirmed with patient; she is using Cetirizine -4 tablets every morning; famotidine- 1 tablet twice daily; montelukast 10mg - 1 tablet every morning, and Xolair every four weeks.  Please advise.

## 2020-02-28 NOTE — Telephone Encounter (Signed)
Natasha Cole called in and states she is itching again and has hives on her chest and this has been going on for about a week now.  She would like to know what do to.  Please advise.

## 2020-02-28 NOTE — Telephone Encounter (Signed)
Please make sure that [name] is performing the following:  1.  Continue the following:               A. Cetirizine 10 mg - 1-2 tablets 1-2 times a day (40 mg max)             B. Famotidine 20 mg - 1 tablet twice a day             C. Montelukast 10 mg - 1 tablet once a day   2. Omalizumab injections every 4 weeks  We may need to give her a low-dose of prednisone if she has breakthrough hives.

## 2020-02-28 NOTE — Telephone Encounter (Signed)
Called patient and informed her of Prednisone RX.  ERX sent to [facility] in [place] as patient requested.

## 2020-03-17 ENCOUNTER — Ambulatory Visit (INDEPENDENT_AMBULATORY_CARE_PROVIDER_SITE_OTHER): Payer: Medicare Other | Admitting: *Deleted

## 2020-03-17 ENCOUNTER — Other Ambulatory Visit: Payer: Self-pay

## 2020-03-17 DIAGNOSIS — L501 Idiopathic urticaria: Secondary | ICD-10-CM

## 2020-03-18 ENCOUNTER — Other Ambulatory Visit: Payer: Self-pay | Admitting: Physician Assistant

## 2020-03-18 NOTE — Telephone Encounter (Signed)
review 

## 2020-03-24 ENCOUNTER — Other Ambulatory Visit: Payer: Self-pay | Admitting: Physician Assistant

## 2020-03-31 ENCOUNTER — Other Ambulatory Visit: Payer: Self-pay | Admitting: Allergy and Immunology

## 2020-03-31 ENCOUNTER — Other Ambulatory Visit: Payer: Self-pay | Admitting: Physician Assistant

## 2020-03-31 NOTE — Telephone Encounter (Signed)
Please review

## 2020-04-02 ENCOUNTER — Encounter: Payer: Self-pay | Admitting: Physician Assistant

## 2020-04-02 ENCOUNTER — Telehealth (INDEPENDENT_AMBULATORY_CARE_PROVIDER_SITE_OTHER): Payer: Medicare Other | Admitting: Physician Assistant

## 2020-04-02 DIAGNOSIS — G4700 Insomnia, unspecified: Secondary | ICD-10-CM | POA: Diagnosis not present

## 2020-04-02 DIAGNOSIS — F411 Generalized anxiety disorder: Secondary | ICD-10-CM | POA: Diagnosis not present

## 2020-04-02 DIAGNOSIS — F3341 Major depressive disorder, recurrent, in partial remission: Secondary | ICD-10-CM | POA: Diagnosis not present

## 2020-04-02 DIAGNOSIS — Z79899 Other long term (current) drug therapy: Secondary | ICD-10-CM

## 2020-04-02 DIAGNOSIS — G2581 Restless legs syndrome: Secondary | ICD-10-CM

## 2020-04-02 MED ORDER — BUSPIRONE HCL 30 MG PO TABS: ORAL_TABLET | ORAL | 1 refills | Status: DC

## 2020-04-02 MED ORDER — PRAMIPEXOLE DIHYDROCHLORIDE 0.125 MG PO TABS: 0.2500 mg | ORAL_TABLET | Freq: Two times a day (BID) | ORAL | 1 refills | Status: DC

## 2020-04-02 MED ORDER — MIRTAZAPINE 45 MG PO TABS: ORAL_TABLET | ORAL | 1 refills | Status: DC

## 2020-04-02 MED ORDER — LITHIUM CARBONATE 150 MG PO CAPS: ORAL_CAPSULE | ORAL | 1 refills | Status: DC

## 2020-04-02 NOTE — Progress Notes (Signed)
[facility]  Patient ID: Natasha Cole,  MRN: [id number]  PCP: Krystal Clark, NP   Date of Evaluation: 04/02/2020 Time spent:30 minutes  Chief Complaint:  Chief Complaint    Anxiety; Depression; Insomnia     Virtual Visit via Telehealth  I connected with patient by telephone, with their informed consent, and verified patient privacy and that I am speaking with the correct person using two identifiers.  I am private, in my office and the patient is at home.  I discussed the limitations, risks, security and privacy concerns of performing an evaluation and management service by telephone and the availability of in person appointments. I also discussed with the patient that there may be a patient responsible charge related to this service. The patient expressed understanding and agreed to proceed.   I discussed the assessment and treatment plan with the patient. The patient was provided an opportunity to ask questions and all were answered. The patient agreed with the plan and demonstrated an understanding of the instructions.   The patient was advised to call back or seek an in-person evaluation if the symptoms worsen or if the condition fails to improve as anticipated.  I provided 30 minutes of non-face-to-face time during this encounter.  HISTORY/CURRENT STATUS: HPI for routine med check.   She is doing very well.  Well.  "I feel good."  She does not work outside the home, she enjoys spending time with her family and grandchildren.  Patient denies loss of interest in usual activities and is able to enjoy things.  Denies decreased energy or motivation.  Appetite has not changed.  No extreme sadness, tearfulness, or feelings of hopelessness.  Denies any changes in concentration, making decisions or remembering things.  Denies suicidal or homicidal thoughts.  She sleeps pretty well and the pramipexole helps with the jerking and weird movements in her legs.   Well controlled with current treatment.  No panic attacks.  Patient denies increased energy with decreased need for sleep, no increased talkativeness, no racing thoughts, no impulsivity or risky behaviors, no increased spending, no increased libido, no grandiosity, no increased irritability or anger, and no hallucinations.  Denies muscle or joint pain, stiffness, or dystonia. Denies dizziness, syncope, seizures, numbness, tingling, tremor, tics, unsteady gait, slurred speech, confusion.   Individual Medical History/ Review of Systems: Changes? :No    Past medications for mental health diagnoses include: Lithium, Seroquel, Latuda, Abilify, Remeron, BuSpar, Effexor, Deplin, Rexulti, Risperdal, Cymbalta, Prozac, Pristiq, Lamictal, Wellbutrin, trazodone, Viibryd, Zoloft, Mirapex, Klonopin, imipramine caused rash and itching  Allergies: Imipramine  Current Medications:  Current Outpatient Medications:  .  busPIRone (BUSPAR) 30 MG tablet, TAKE 1 TABLET BY MOUTH 2 TIMES DAILY GENERIC EQUIVALENT FOR BUSPAR, Disp: 180 tablet, Rfl: 1 .  calcium carbonate (OS-CAL) 600 MG TABS tablet, Take by mouth., Disp: , Rfl:  .  cetirizine (ZYRTEC) 10 MG tablet, Take 10 mg by mouth daily., Disp: , Rfl:  .  EPINEPHrine 0.3 mg/0.3 mL IJ SOAJ injection, Use as directed for life threatening allergic reactions only, Disp: 2 each, Rfl: 3 .  famotidine (PEPCID) 20 MG tablet, TAKE ONE TABLET BY MOUTH TWICE DAILY, Disp: 180 tablet, Rfl: 1 .  hydrOXYzine (ATARAX/VISTARIL) 25 MG tablet, TAKE ONE-HALF TO TWO TABLETS BY MOUTH EVERY EIGHT HOURS AS NEEDED FOR ANXIETY OR SLEEP, Disp: 60 tablet, Rfl: 0 .  levothyroxine (SYNTHROID, LEVOTHROID) 50 MCG tablet, Take by mouth., Disp: , Rfl:  .  lithium carbonate 150 MG capsule, Take 4 capsules  daily, Disp: 360 capsule, Rfl: 1 .  mirtazapine (REMERON) 45 MG tablet, TAKE 1 TABLET BY MOUTH AT BEDTIME GENERIC EQUIVALENT FOR REMERON, Disp: 90 tablet, Rfl: 1 .  montelukast (SINGULAIR) 10 MG  tablet, Take one tablet once daily as directed, Disp: 90 tablet, Rfl: 1 .  pramipexole (MIRAPEX) 0.125 MG tablet, Take 2 tablets (0.25 mg total) by mouth 2 (two) times daily., Disp: 360 tablet, Rfl: 1 .  rosuvastatin (CRESTOR) 10 MG tablet, TAKE 1 TABLET BY MOUTH ONCE DAILY, Disp: , Rfl:  .  vitamin E 400 UNIT capsule, Take by mouth., Disp: , Rfl:   Current Facility-Administered Medications:  .  omalizumab Geoffry Paradise) injection 300 mg, 300 mg, Subcutaneous, Q28 days, Kozlow, Alvira Philips, MD, 300 mg at 03/17/20 1033 Medication Side Effects: none  Family Medical/ Social History: Changes? No  MENTAL HEALTH EXAM:  There were no vitals taken for this visit.There is no height or weight on file to calculate BMI.  General Appearance: unable to assess  Eye Contact:  unable to assess  Speech:  Clear and Coherent  Volume:  Normal  Mood:  Euthymic  Affect:  unable to assess  Thought Process:  Goal Directed and Descriptions of Associations: Intact  Orientation:  Full (Time, Place, and Person)  Thought Content: Logical   Suicidal Thoughts:  No  Homicidal Thoughts:  No  Memory:  WNL  Judgement:  Good  Insight:  Good  Psychomotor Activity:  unable to assess  Concentration:  Concentration: Good  Recall:  Good  Fund of Knowledge: Good  Language: Good  Assets:  Desire for Improvement  ADL's:  Intact  Cognition: WNL  Prognosis:  Good    DIAGNOSES:    ICD-10-CM   1. Recurrent major depressive disorder, in partial remission (HCC)  F33.41   2. Encounter for long-term (current) use of medications  Z79.899 Lithium level    Basic metabolic panel    TSH  3. Generalized anxiety disorder  F41.1   4. Insomnia, unspecified type  G47.00   5. Restless leg syndrome  G25.81     Receiving Psychotherapy: No    RECOMMENDATIONS:  PDMP was reviewed. I provided 30 minutes of nonface-to-face time during this encounter including time I have reviewing last labs. I am glad to see her doing so well! Continue  BuSpar 30 mg 1 twice daily. Continue lithium 600 mg daily. Continue Remeron 45 mg nightly Continue pramipexole 0.125 mg, 2 nightly. Labs ordered as above. Return in 6 months.  Melony Overly, PA-C

## 2020-04-04 ENCOUNTER — Other Ambulatory Visit: Payer: Self-pay | Admitting: Sports Medicine

## 2020-04-04 MED ORDER — DICLOFENAC SODIUM 75 MG PO TBEC: 75.0000 mg | DELAYED_RELEASE_TABLET | Freq: Two times a day (BID) | ORAL | 0 refills | Status: DC

## 2020-04-04 NOTE — Progress Notes (Signed)
Refilled voltaren pill to help with pain if worsens need f/u appt

## 2020-04-08 ENCOUNTER — Telehealth: Payer: Self-pay | Admitting: *Deleted

## 2020-04-08 NOTE — Telephone Encounter (Signed)
-----   Message from Asencion Islam, [place] sent at 04/04/2020  5:09 PM EDT ----- Refill sent ----- Message ----- From: Lanney Gins, PMAC Sent: 04/04/2020   2:34 PM EDT To: Asencion Islam, DPM  Hey Dr Marylene Land, patient called and stated that she needed an RX that you prescribed last time because it helped and the pain comes and goes in the foot and it was voltaren and is [facility]. [name]

## 2020-04-08 NOTE — Telephone Encounter (Signed)
Called and spoke with the patient and the patient did receive the prescription that Dr Marylene Land sent over. [name]

## 2020-04-10 ENCOUNTER — Telehealth: Payer: Self-pay | Admitting: Allergy and Immunology

## 2020-04-10 NOTE — Telephone Encounter (Signed)
Natasha Cole called in and asked if allergies could cause loss of taste or smell, and I told her possibly due to a sinus infection.  Natasha Cole stated she didn't have a sinus infection, just a lot of itching.  I did tell Natasha Cole loss of taste and smell was a symptom of Covid and she stated she knew and was going to get tested this afternoon.  Natasha Cole just wanted me to let Dr. Lucie Leather know she is still having a lot of itching and didn't know if there were some changes that could be made to her medications.  I did inform her Dr. Lucie Leather was out of the office until Monday and she stated she was ok with that and didn't mind waiting until he returned.  Please advise.

## 2020-04-14 ENCOUNTER — Ambulatory Visit: Payer: Medicare Other

## 2020-04-14 NOTE — Telephone Encounter (Signed)
Appointment scheduled.

## 2020-04-14 NOTE — Telephone Encounter (Signed)
Please have Natasha Cole make an appointment to see me as we started her on omalizumab and April and we need to discuss what our next plan would be regarding her response to this treatment.

## 2020-04-17 ENCOUNTER — Other Ambulatory Visit: Payer: Self-pay

## 2020-04-17 ENCOUNTER — Ambulatory Visit (INDEPENDENT_AMBULATORY_CARE_PROVIDER_SITE_OTHER): Payer: Medicare Other | Admitting: *Deleted

## 2020-04-17 DIAGNOSIS — L501 Idiopathic urticaria: Secondary | ICD-10-CM

## 2020-04-18 ENCOUNTER — Other Ambulatory Visit: Payer: Self-pay | Admitting: Physician Assistant

## 2020-04-20 NOTE — Telephone Encounter (Signed)
Patient still taking

## 2020-04-23 ENCOUNTER — Telehealth: Payer: Self-pay | Admitting: Physician Assistant

## 2020-04-23 ENCOUNTER — Encounter: Payer: Self-pay | Admitting: Physician Assistant

## 2020-04-23 NOTE — Telephone Encounter (Signed)
Not sure if you have reviewed labs yet

## 2020-04-23 NOTE — Telephone Encounter (Signed)
Natasha Cole called wanting to know her lab results.  Please call her to discuss

## 2020-04-24 ENCOUNTER — Other Ambulatory Visit: Payer: Self-pay

## 2020-04-24 ENCOUNTER — Other Ambulatory Visit: Payer: Self-pay | Admitting: Physician Assistant

## 2020-04-24 ENCOUNTER — Encounter: Payer: Self-pay | Admitting: Allergy and Immunology

## 2020-04-24 ENCOUNTER — Ambulatory Visit (INDEPENDENT_AMBULATORY_CARE_PROVIDER_SITE_OTHER): Payer: Medicare Other | Admitting: Allergy and Immunology

## 2020-04-24 VITALS — BP 122/78 | HR 72 | Resp 16

## 2020-04-24 DIAGNOSIS — Z79899 Other long term (current) drug therapy: Secondary | ICD-10-CM

## 2020-04-24 DIAGNOSIS — L501 Idiopathic urticaria: Secondary | ICD-10-CM | POA: Diagnosis not present

## 2020-04-24 DIAGNOSIS — L299 Pruritus, unspecified: Secondary | ICD-10-CM | POA: Diagnosis not present

## 2020-04-24 DIAGNOSIS — D7219 Other eosinophilia: Secondary | ICD-10-CM | POA: Diagnosis not present

## 2020-04-24 NOTE — Progress Notes (Signed)
[place] - [place] - [place] - [place] - [place]   Follow-up Note  Referring Provider: Rhea Bleacher* Primary Provider: Krystal Clark, NP Date of Office Visit: 04/24/2020  Subjective:   Natasha Cole (DOB: May 25, 1971) is a 49 y.o. female [facility] in re-evaluation of the following:  HPI: Natasha Cole returns to this clinic in evaluation of her pruritic disorder with a history of inflammatory dermatosis and urticaria.  I last saw in this clinic on 27 September 2019 at which point time we started her on omalizumab.  She has noticed very little improvement while using omalizumab over the course of the past several months.  She is about the same meaning that she is pruritic on a pretty consistent basis although she has not had any urticaria.  It is really the pruritus that bothers her the most.  Fortunately, she does not have disturbance of sleep secondary to this pruritus.  She has had no associated systemic or constitutional symptoms that have developed with this pruritus.  She continues to use a combination of an H1 and H2 receptor blocker and a leukotriene modifier.  We obtained blood test when she was last seen in this clinic and she did have a elevated TSH and she was informed of this issue and it was recommended that she increase her dose of thyroid supplementation as directed by either her primary care doctor or endocrinologist.  Allergies as of 04/24/2020      Reactions   Imipramine Itching      Medication List    busPIRone 30 MG tablet Commonly known as: BUSPAR TAKE 1 TABLET BY MOUTH 2 TIMES DAILY GENERIC EQUIVALENT FOR BUSPAR   cetirizine 10 MG tablet Commonly known as: ZYRTEC Take 10 mg by mouth daily.   diclofenac 75 MG EC tablet Commonly known as: VOLTAREN Take 1 tablet (75 mg total) by mouth 2 (two) times daily.   EPINEPHrine 0.3 mg/0.3 mL Soaj injection Commonly known as: EPI-PEN Use as  directed for life threatening allergic reactions only   famotidine 20 MG tablet Commonly known as: PEPCID TAKE ONE TABLET BY MOUTH TWICE DAILY   hydrOXYzine 25 MG tablet Commonly known as: ATARAX/VISTARIL TAKE ONE-HALF TO TWO TABLETS BY MOUTH EVERY EIGHT HOURS AS NEEDED FOR ANXIETY OR SLEEP   levothyroxine 50 MCG tablet Commonly known as: SYNTHROID Take by mouth.   lithium carbonate 150 MG capsule Take 4 capsules daily   mirtazapine 45 MG tablet Commonly known as: REMERON TAKE 1 TABLET BY MOUTH AT BEDTIME GENERIC EQUIVALENT FOR REMERON   montelukast 10 MG tablet Commonly known as: SINGULAIR Take one tablet once daily as directed   pramipexole 0.125 MG tablet Commonly known as: MIRAPEX Take 2 tablets (0.25 mg total) by mouth 2 (two) times daily.   rosuvastatin 10 MG tablet Commonly known as: CRESTOR TAKE 1 TABLET BY MOUTH ONCE DAILY   vitamin E 180 MG (400 UNITS) capsule Take by mouth.       Past Medical History:  Diagnosis Date  . Depression   . Hypothyroidism     Past Surgical History:  Procedure Laterality Date  . ANKLE SURGERY    . CHOLECYSTECTOMY    . gastic bypass  2012  . reversal gastric bypass  2018  . TUBAL LIGATION      Review of systems negative except as noted in HPI / PMHx or noted below:  Review of Systems  Constitutional: Negative.   HENT: Negative.  Eyes: Negative.   Respiratory: Negative.   Cardiovascular: Negative.   Gastrointestinal: Negative.   Genitourinary: Negative.   Musculoskeletal: Negative.   Skin: Negative.   Neurological: Negative.   Endo/Heme/Allergies: Negative.   Psychiatric/Behavioral: Negative.      Objective:   Vitals:   04/24/20 1016  BP: 122/78  Pulse: 72  Resp: 16          Physical Exam Constitutional:      Appearance: She is not diaphoretic.  HENT:     Head: Normocephalic.     Right Ear: Tympanic membrane, ear canal and external ear normal.     Left Ear: Tympanic membrane, ear canal and  external ear normal.     Nose: Nose normal. No mucosal edema or rhinorrhea.     Mouth/Throat:     Pharynx: Uvula midline. No oropharyngeal exudate.  Eyes:     Conjunctiva/sclera: Conjunctivae normal.  Neck:     Thyroid: No thyromegaly.     Trachea: Trachea normal. No tracheal tenderness or tracheal deviation.  Cardiovascular:     Rate and Rhythm: Normal rate and regular rhythm.     Heart sounds: Normal heart sounds, S1 normal and S2 normal. No murmur heard.   Pulmonary:     Effort: No respiratory distress.     Breath sounds: Normal breath sounds. No stridor. No wheezing or rales.  Lymphadenopathy:     Head:     Right side of head: No tonsillar adenopathy.     Left side of head: No tonsillar adenopathy.     Cervical: No cervical adenopathy.  Skin:    Findings: No erythema or rash.     Nails: There is no clubbing.  Neurological:     Mental Status: She is alert.     Diagnostics:   Results of blood tests obtained 27 September 2019 identified WBC 8.7, absolute eosinophil 500, absolute lymphocyte 2300, creatinine 1.32 mg/DL, AST 13 U/L, ALT 13 U/L, hemoglobin 14.2, thyroid peroxidase antibody less than 9U/mL, free T4 1.07 NG/DL, TSH 5.681 IU/mL  Assessment and Plan:   1. Pruritic disorder   2. Idiopathic urticaria   3. Other eosinophilia     1.  Continue the following:   A. Cetirizine 10 mg - 1-2 tablets 1-2 times a day (40 mg max)  B. Famotidine 20 mg - 1 tablet twice a day  C. Montelukast 10 mg - 1 tablet once a day  2. Discontinue Omalizumab    3. Obtain Covid vaccine and Flu vaccine  4. Consider visit with [facility] for urticaria and pruritis  5. Return to clinic in 6 months or earlier if problem  I do not think that [name] has received benefit from omalizumab and we are going to discontinue this treatment.  She will continue on her current therapy as noted above.  The next step in her evaluation and treatment would [facility]itus and occasional urticaria and she is presently considering this option.  She does have eosinophilia but there is no obvious etiologic factor responsible for this issue at this point.  I will see her back in his clinic in 6 months or earlier if there is a problem.  Laurette Schimke, MD Allergy / Immunology [facility]

## 2020-04-24 NOTE — Telephone Encounter (Signed)
There were several changes we need to make.BUN is normal but creatinine is still up a little bit at 1.4.  Advised her to avoid aspirin, Advil, or Aleve, and drink a lot of water.  The lithium could be affecting her kidneys, but it is not at a dangerous level right now.  We will decrease the lithium and then recheck.TSH is too low.  0.007.  Stop the Synthroid.Lithium is 1.1.  Let us decrease that to 450 mg (3 pills of her 150 mg.) Repeat TSH, BMP, and lithium level in 1 month.  I have ordered those and have asked Delice Bison to mail them to patient.

## 2020-04-24 NOTE — Progress Notes (Signed)
No action needed.  See phone note.

## 2020-04-24 NOTE — Patient Instructions (Addendum)
  1.  Continue the following:   A. Cetirizine 10 mg - 1-2 tablets 1-2 times a day (40 mg max)  B. Famotidine 20 mg - 1 tablet twice a day  C. Montelukast 10 mg - 1 tablet once a day  2. Discontinue Omalizumab    3. Obtain Covid vaccine and Flu vaccine  4. Consider visit with [facility] for urticaria and pruritis  5. Return to clinic in 6 months or earlier if problem

## 2020-04-25 NOTE — Telephone Encounter (Signed)
Rtc to patient and discussed her lab levels, she said occasionally she has taken Aleve but not on a regular basis, advised her to try and avoid it if she could and encouraged drinking water. Instructed her to stop Synthroid and decrease her Lithium to 450 mg daily or 3 of her 150 mg. She verbalized understanding. Advised her to get labs drawn the end of next month or first week in December. She will call back with any concerns or questions.

## 2020-04-28 ENCOUNTER — Encounter: Payer: Self-pay | Admitting: Allergy and Immunology

## 2020-05-01 ENCOUNTER — Telehealth: Payer: Self-pay | Admitting: Sports Medicine

## 2020-05-01 ENCOUNTER — Other Ambulatory Visit: Payer: Self-pay | Admitting: Sports Medicine

## 2020-05-01 MED ORDER — DICLOFENAC SODIUM 75 MG PO TBEC
75.0000 mg | DELAYED_RELEASE_TABLET | Freq: Two times a day (BID) | ORAL | 0 refills | Status: DC
Start: 2020-05-01 — End: 2020-05-14

## 2020-05-01 NOTE — Telephone Encounter (Signed)
Pt is calling about a refill on her diclofenac. Pharmacy is waiting on an approval from Dr. Marylene Land.

## 2020-05-01 NOTE — Telephone Encounter (Signed)
Thanks I have sent refill to [facility] pharmacy in [place] Thanks Dr. Kathie Rhodes

## 2020-05-01 NOTE — Progress Notes (Signed)
Diclofenac sent to [facility] for refill. -Dr. Kathie Rhodes

## 2020-05-09 ENCOUNTER — Other Ambulatory Visit: Payer: Self-pay | Admitting: Physician Assistant

## 2020-05-14 ENCOUNTER — Other Ambulatory Visit: Payer: Self-pay | Admitting: Sports Medicine

## 2020-05-14 ENCOUNTER — Telehealth: Payer: Self-pay | Admitting: *Deleted

## 2020-05-14 MED ORDER — DICLOFENAC SODIUM 75 MG PO TBEC
75.0000 mg | DELAYED_RELEASE_TABLET | Freq: Two times a day (BID) | ORAL | 2 refills | Status: DC
Start: 2020-05-14 — End: 2021-09-21

## 2020-05-14 NOTE — Telephone Encounter (Signed)
Patient called and left a message stating that she needed a refill of the diclofenac sodium 75 mg tablet and wanted it send to [facility] and was wandering if Dr Marylene Land could write for a months worth and per Dr Marylene Land is going to do a 90 day supply and I called and relayed the message to the patient. [name]

## 2020-05-14 NOTE — Progress Notes (Signed)
Sent Diclofenac 90 day supply

## 2020-05-15 ENCOUNTER — Ambulatory Visit: Payer: Medicare Other

## 2020-06-06 ENCOUNTER — Other Ambulatory Visit: Payer: Self-pay | Admitting: Physician Assistant

## 2020-06-10 ENCOUNTER — Other Ambulatory Visit: Payer: Self-pay | Admitting: Physician Assistant

## 2020-06-11 ENCOUNTER — Other Ambulatory Visit: Payer: Self-pay | Admitting: Allergy and Immunology

## 2020-06-19 LAB — COLOGUARD: COLOGUARD: POSITIVE — AB

## 2020-07-10 ENCOUNTER — Telehealth: Payer: Self-pay | Admitting: *Deleted

## 2020-07-10 NOTE — Telephone Encounter (Signed)
-----   Message from Asencion Islam, [place] sent at 07/09/2020  1:19 PM EST ----- I would follow the kidney doctors orders. Because they do not want her on NSAIDs the only medication that she may have is tylenol. If pain worsens she may schedule a follow up appointment with me for a steroid shot. -Dr. Marylene Land ----- Message ----- From: Lanney Gins, [facility] Sent: 07/09/2020   1:11 PM EST To: Asencion Islam, DPM  Patient called and stated that she saw her kidney doctor and the doctor is saying that the voltaren medicine and other nsaids are messing with the flow of the kidneys and wants the patient to come off all the medicine and patient stated that she is still in pain and please advise. [name]

## 2020-07-10 NOTE — Telephone Encounter (Signed)
Called and spoke with the patient and relayed the message per Dr Stover. Annistyn Depass °

## 2020-08-04 ENCOUNTER — Other Ambulatory Visit: Payer: Self-pay

## 2020-08-04 MED ORDER — HYDROXYZINE HCL 25 MG PO TABS: ORAL_TABLET | ORAL | 0 refills | Status: DC

## 2020-08-21 ENCOUNTER — Other Ambulatory Visit: Payer: Self-pay | Admitting: Neurosurgery

## 2020-08-21 DIAGNOSIS — M5416 Radiculopathy, lumbar region: Secondary | ICD-10-CM

## 2020-08-21 DIAGNOSIS — M5412 Radiculopathy, cervical region: Secondary | ICD-10-CM

## 2020-09-10 ENCOUNTER — Other Ambulatory Visit: Payer: Self-pay

## 2020-09-10 MED ORDER — HYDROXYZINE HCL 25 MG PO TABS: ORAL_TABLET | ORAL | 0 refills | Status: DC

## 2020-09-12 ENCOUNTER — Other Ambulatory Visit: Payer: Self-pay | Admitting: Physician Assistant

## 2020-09-13 ENCOUNTER — Ambulatory Visit
Admission: RE | Admit: 2020-09-13 | Discharge: 2020-09-13 | Disposition: A | Discharge status: Home / Self Care | Payer: Medicare Other | Source: Ambulatory Visit | Attending: Neurosurgery | Admitting: Neurosurgery

## 2020-09-13 ENCOUNTER — Other Ambulatory Visit: Payer: Self-pay

## 2020-09-13 DIAGNOSIS — M5412 Radiculopathy, cervical region: Secondary | ICD-10-CM

## 2020-09-13 DIAGNOSIS — M5416 Radiculopathy, lumbar region: Secondary | ICD-10-CM

## 2020-09-13 IMAGING — MR MR CERVICAL SPINE W/O CM
4 of 6 series · 29 of 48 positions shown · non-contrast
Comparison: Prior radiograph from [DATE]. Comparison also made
with prior MRI from [DATE].

CLINICAL DATA: Initial evaluation for neck pain with sporadic
numbness/pain in bilateral arms.

EXAM:
MRI CERVICAL SPINE WITHOUT CONTRAST
TECHNIQUE: Multiplanar, multisequence MR imaging of the cervical spine was
performed. No intravenous contrast was administered.

[Series 2: T2 · sagittal · 3.0mm · 0.82mm/px · 5 of 14 slices shown (1 of 2)]
[im 1/14]
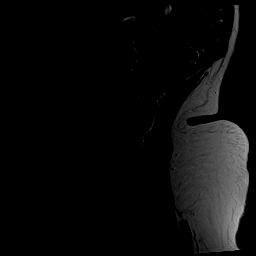
[im 4/14]
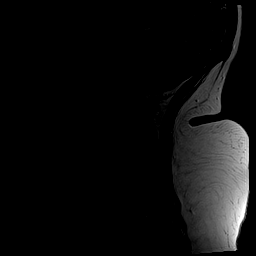
[im 7/14]
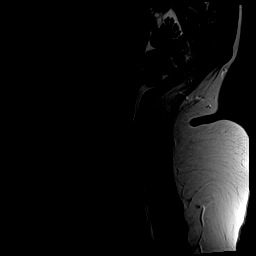
[im 10/14]
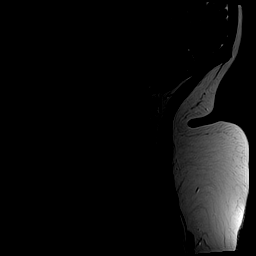
[im 14/14]
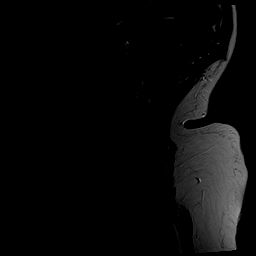

[Series 3: T1 · sagittal · 3.0mm · 0.41mm/px · 5 of 14 slices shown (1 of 2)]
[im 1/14]
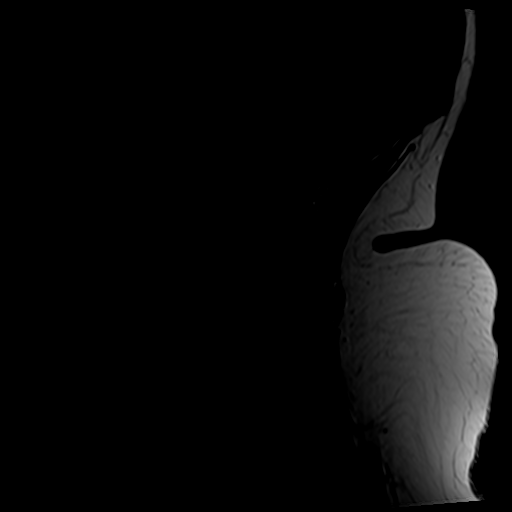
[im 4/14]
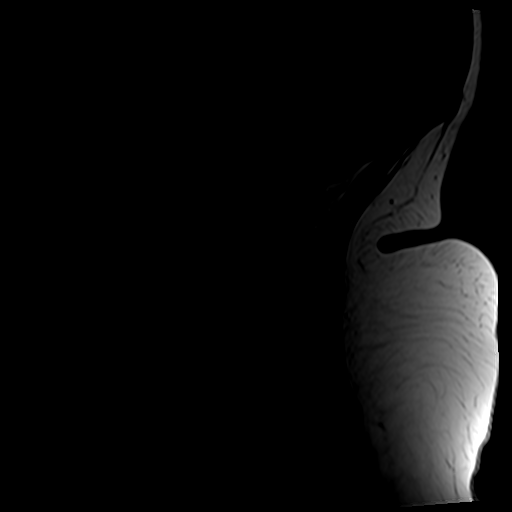
[im 7/14]
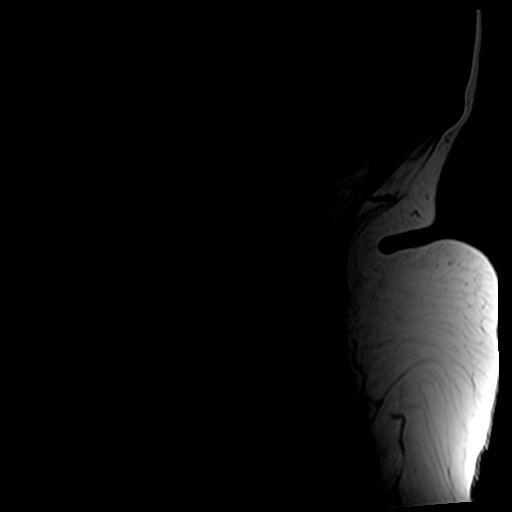
[im 10/14]
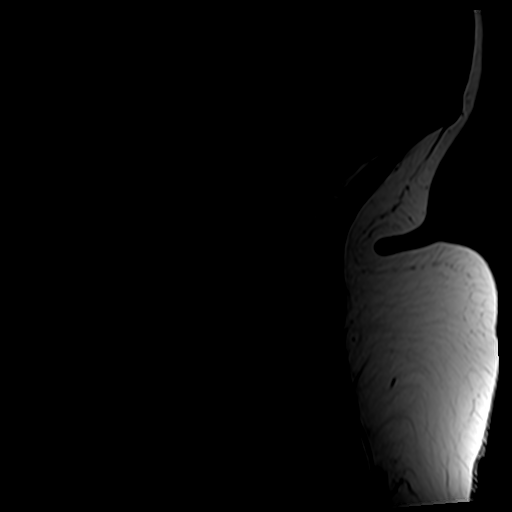
[im 14/14]
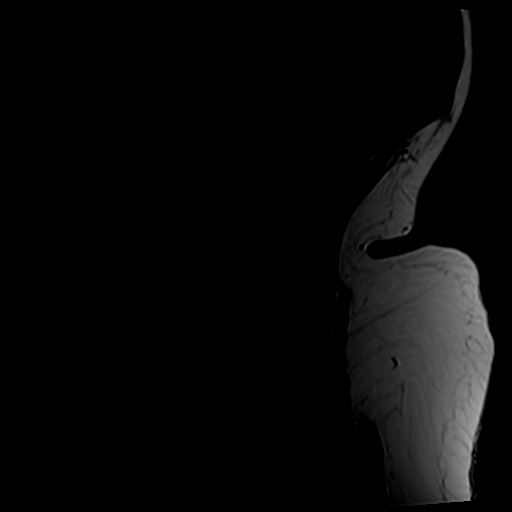

[Series 6: T2 · axial · 3.0mm · 0.70mm/px · z∈[-70,+28]mm · 11 of 28 slices shown (2 of 2)]
[im 1/28]
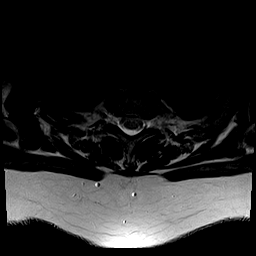
[im 3/28]
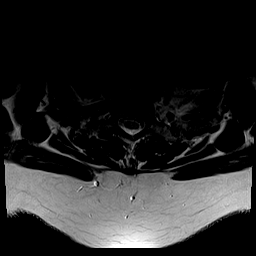
[im 6/28]
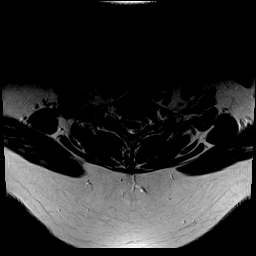
[im 9/28]
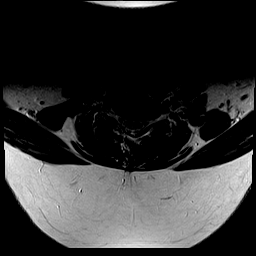
[im 11/28]
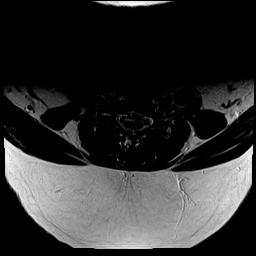
[im 14/28]
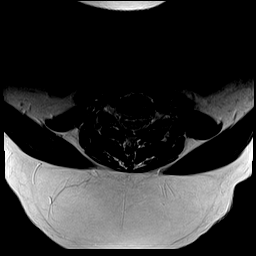
[im 17/28]
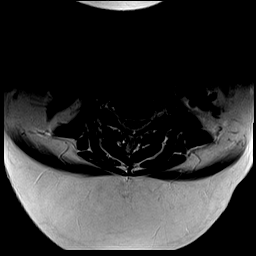
[im 19/28]
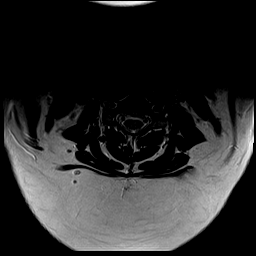
[im 22/28]
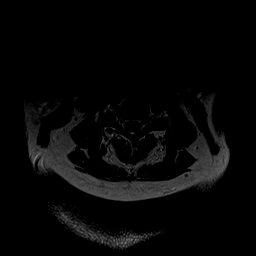
[im 25/28]
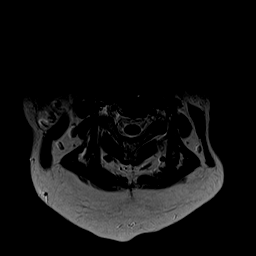
[im 28/28]
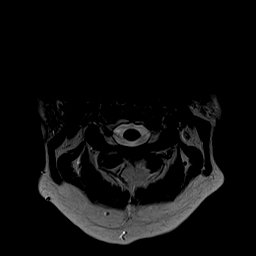

[Series 7: T1 · axial · 3.0mm · 0.35mm/px · z∈[-70,+17]mm · 8 of 28 slices shown (2 of 2)]
[im 1/28]
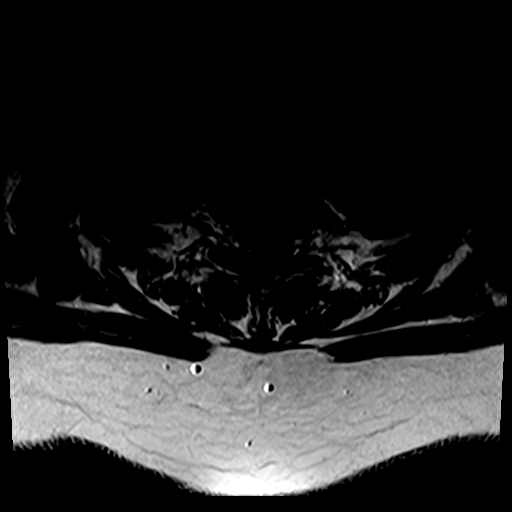
[im 3/28]
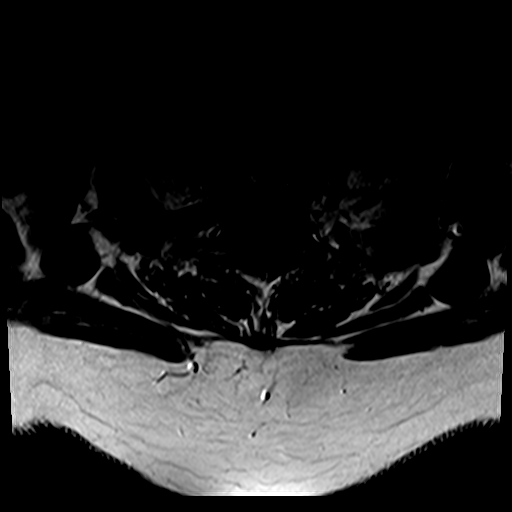
[im 6/28]
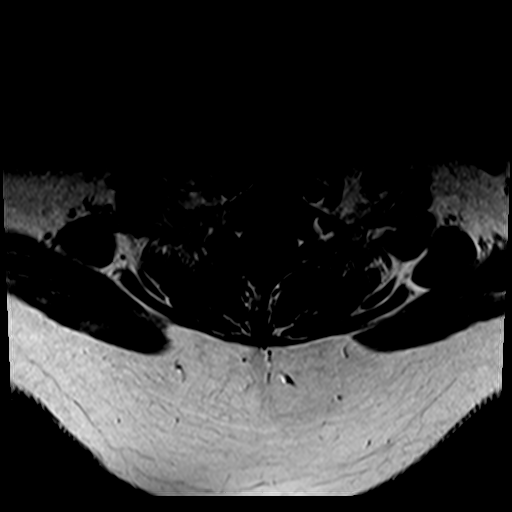
[im 9/28]
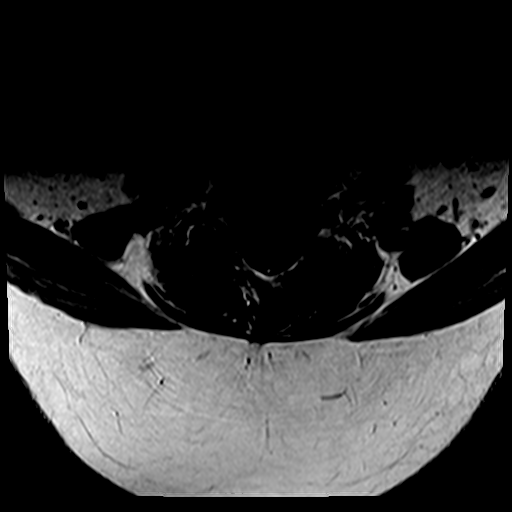
[im 11/28]
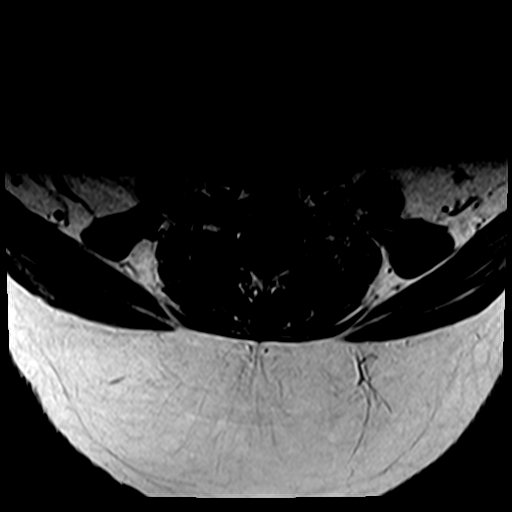
[im 14/28]
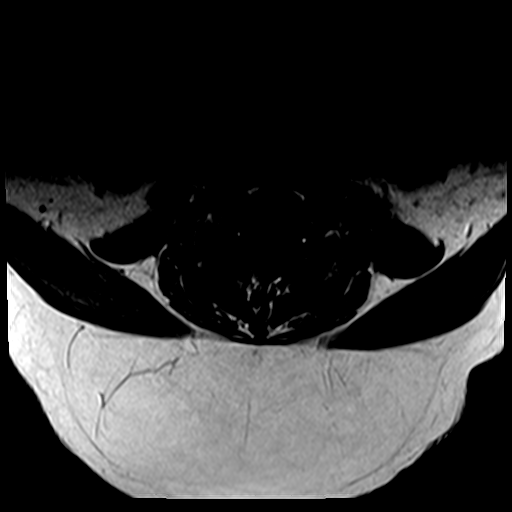
[im 17/28]
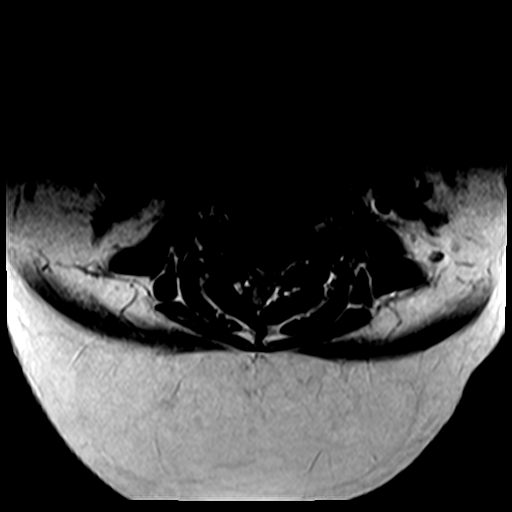
[im 25/28]
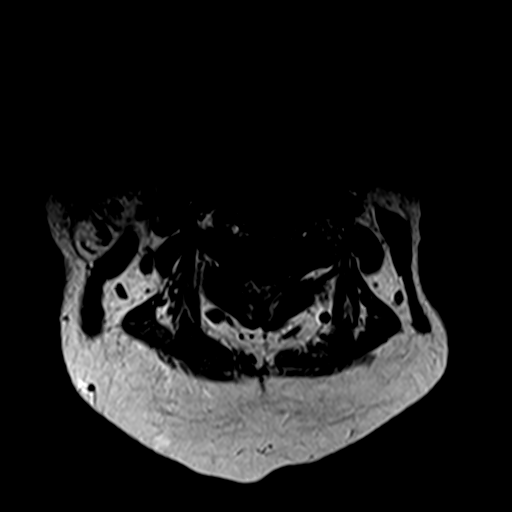

[29 of 48 positions shown; findings below may reference images not displayed]

FINDINGS: Alignment: Straightening with mild reversal of the normal cervical
lordosis. No listhesis.

Vertebrae: Vertebral body height maintained without acute or chronic
fracture. Bone marrow signal intensity within normal limits. No
worrisome osseous lesions. Discogenic reactive endplate change
present about the C5-6 and C6-7 interspaces. No abnormal marrow
edema.

Cord: Normal signal and morphology.

Posterior Fossa, vertebral arteries, paraspinal tissues: Chiari 1
malformation with the cerebellar tonsils extending up to 9 mm
through the foramen magnum. Visualized brain and posterior fossa
otherwise unremarkable. Paraspinous and prevertebral soft tissues
within normal limits. Normal flow voids seen within the vertebral
arteries bilaterally.

Disc levels:

C2-C3: Unremarkable.

C3-C4:  Unremarkable.

C4-C5: Diffuse disc bulge with bilateral uncovertebral hypertrophy.
Broad posterior disc osteophyte flattens and partially faces the
ventral thecal sac with resultant mild spinal stenosis. Minimal
flattening of the ventral cord without cord signal changes. Mild to
moderate right C5 foraminal stenosis. Left neural foramina remains
patent.

C5-C6: Degenerative intervertebral disc space narrowing with diffuse
disc osteophyte complex. Broad based right paracentral to foraminal
component flattens and effaces the ventral thecal sac (series 6,
image 16). Mild cord flattening, greater on the right. No cord
signal changes. Mild spinal stenosis. Mild right C6 foraminal
narrowing. Left neural foramina remains patent.

C6-C7: Degenerative intervertebral disc space narrowing with diffuse
disc osteophyte complex. Flattening and partial effacement of the
ventral thecal sac with resultant mild spinal stenosis. Mild left
greater than right C7 foraminal narrowing.

C7-T1:  Unremarkable.

Visualized upper thoracic spine demonstrates no significant finding.
IMPRESSION: 1. Multilevel cervical spondylosis with resultant mild diffuse
spinal stenosis at C4-5 through C6-7.
2. Multifactorial degenerative changes with resultant mild to
moderate right C5 and C6 foraminal narrowing, with mild bilateral C7
foraminal stenosis.
3. Chiari 1 malformation with the cerebellar tonsils extending up to
9 mm through the foramen magnum.

## 2020-09-13 IMAGING — MR MR LUMBAR SPINE W/O CM
4 of 5 series · 25 of 48 positions shown · non-contrast
Comparison: Previous radiograph from [DATE] as well as previous
MRI from [DATE].

CLINICAL DATA: Initial evaluation for low back pain and weakness
with radiation into the bilateral lower extremities. Worsening.

EXAM:
MRI LUMBAR SPINE WITHOUT CONTRAST
TECHNIQUE: Multiplanar, multisequence MR imaging of the lumbar spine was
performed. No intravenous contrast was administered.

[Series 2: T2 · sagittal · 4.0mm · 0.53mm/px · 6 of 14 slices shown (1 of 2)]
[im 1/14]
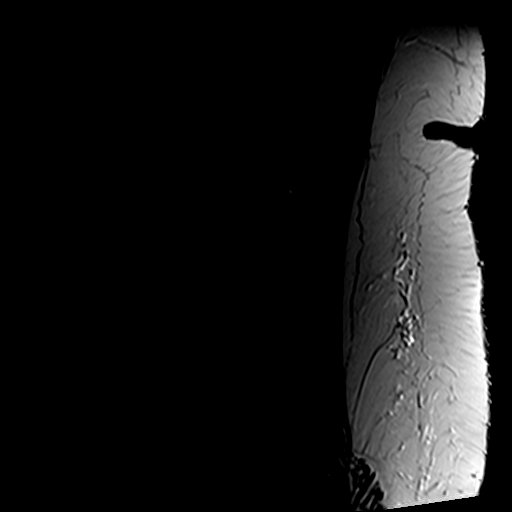
[im 3/14]
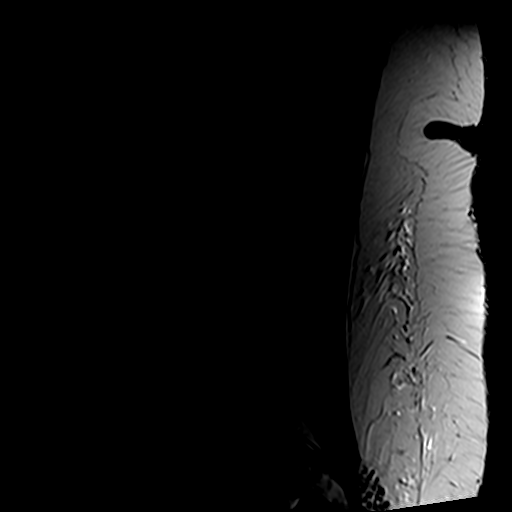
[im 6/14]
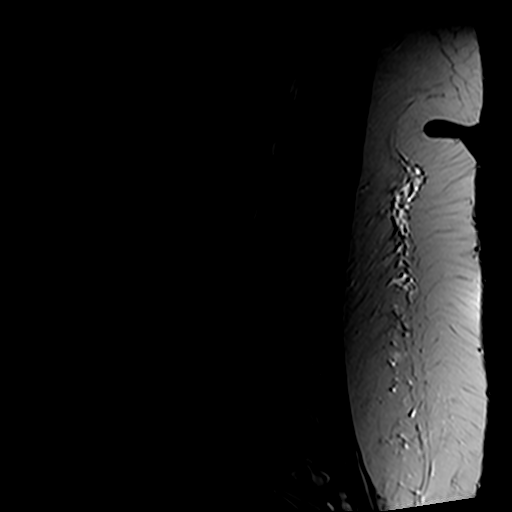
[im 8/14]
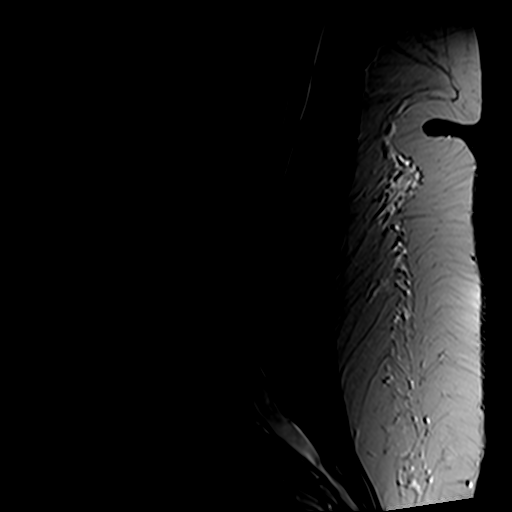
[im 11/14]
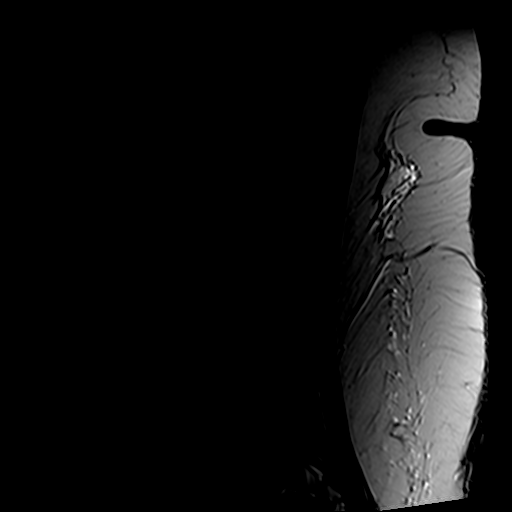
[im 14/14]
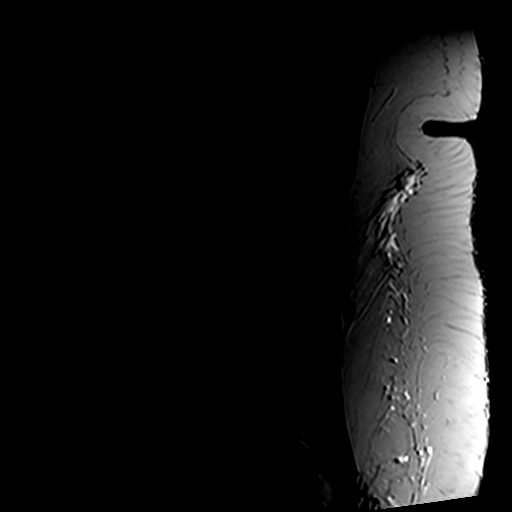

[Series 4: T1 · sagittal · 4.0mm · 0.53mm/px · 6 of 14 slices shown (1 of 2)]
[im 1/14]
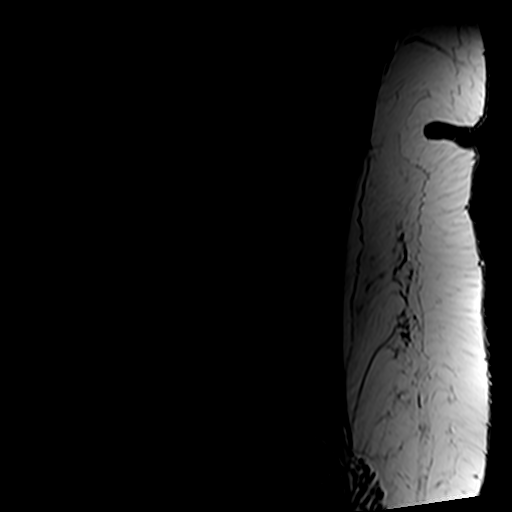
[im 3/14]
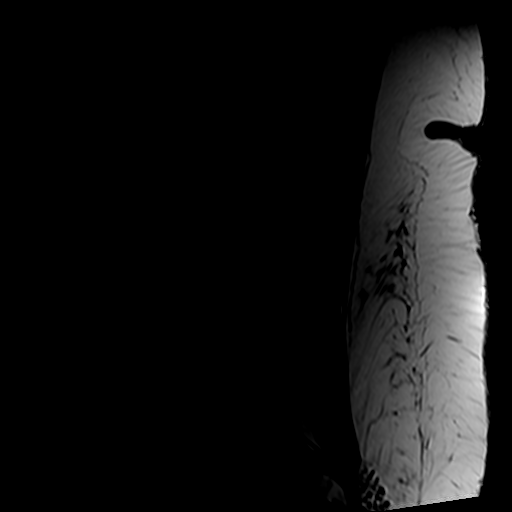
[im 6/14]
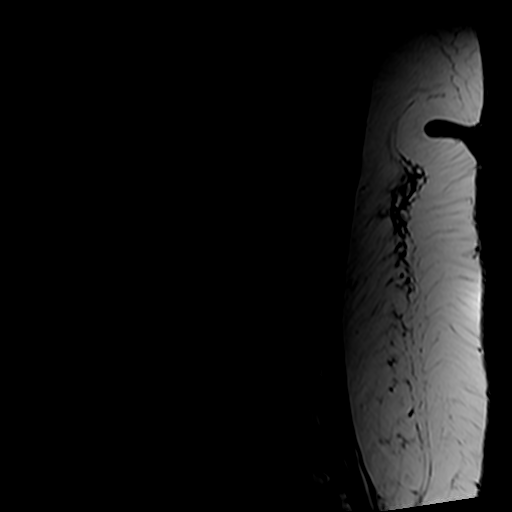
[im 8/14]
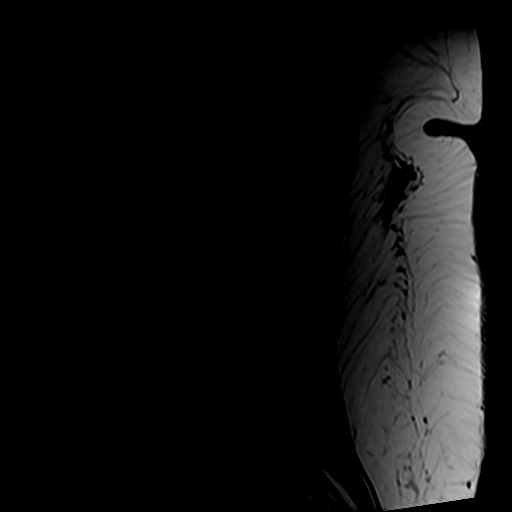
[im 11/14]
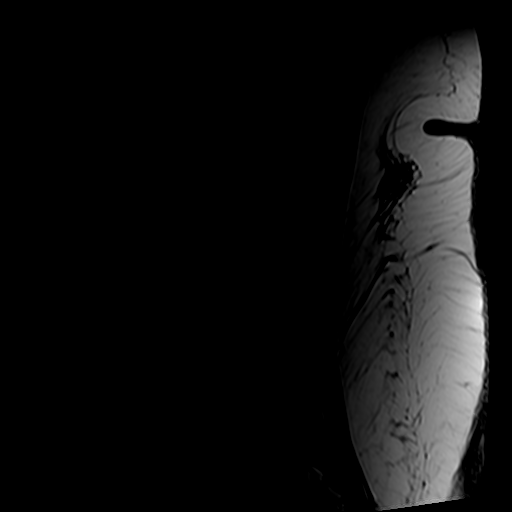
[im 14/14]
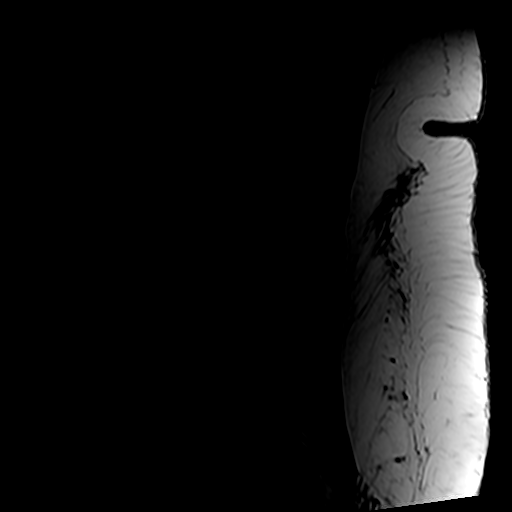

[Series 5: T2 · axial · 4.0mm · 0.70mm/px · z∈[-103,+119]mm · 9 of 36 slices shown (2 of 2)]
[im 1/36]
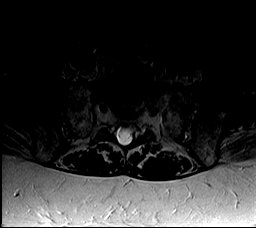
[im 6/36]
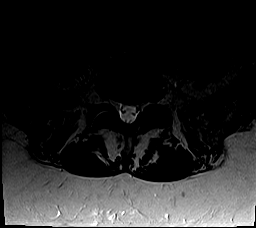
[im 11/36]
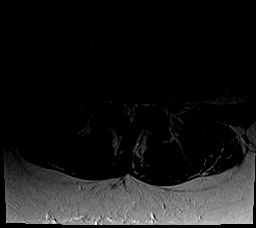
[im 16/36]
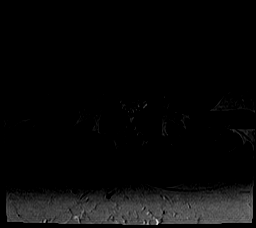
[im 18/36]
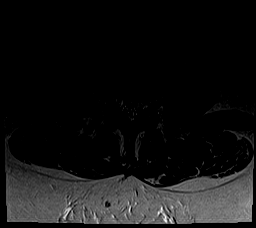
[im 21/36]
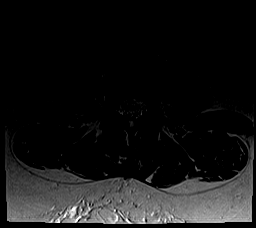
[im 26/36]
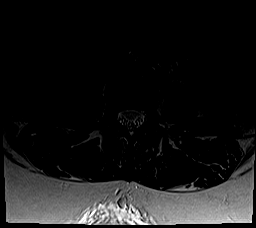
[im 31/36]
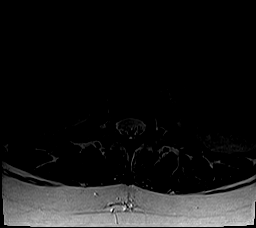
[im 36/36]
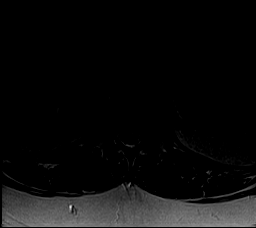

[Series 6: T1 · axial · 4.0mm · 0.35mm/px · z∈[-103,+88]mm · 4 of 36 slices shown (2 of 2)]
[im 1/36]
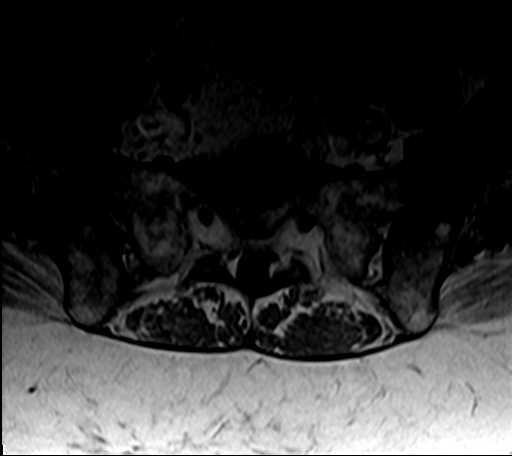
[im 6/36]
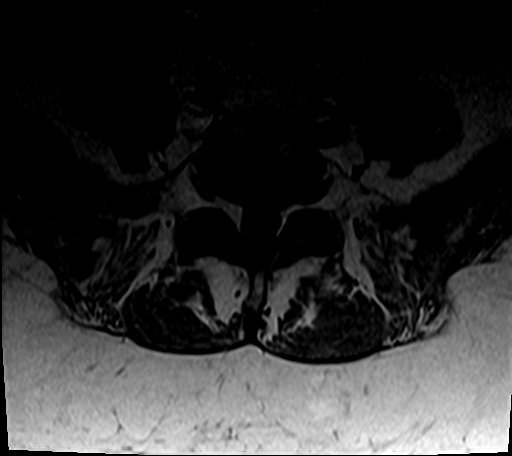
[im 18/36]
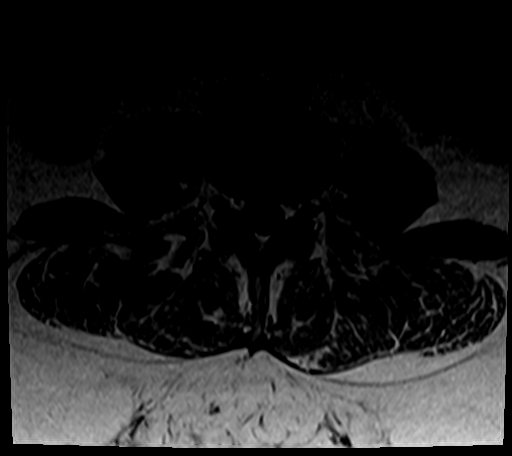
[im 31/36]
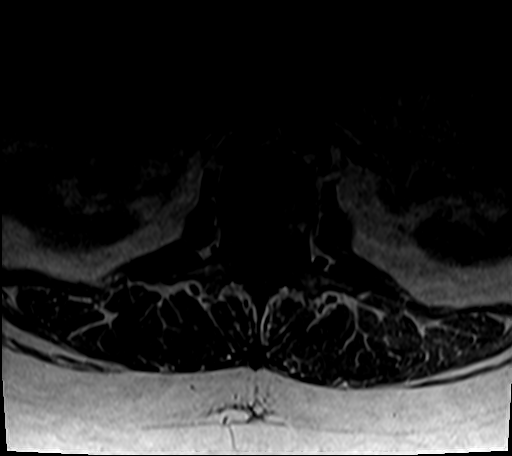

[25 of 48 positions shown; findings below may reference images not displayed]

FINDINGS: Segmentation: Standard. Lowest well-formed disc space labeled the
L5-S1 level.

Alignment: Trace 2 mm anterolisthesis of L4 on L5, with trace
retrolisthesis of L3 on L4 and L5 on S1. Findings chronic and facet
mediated.

Vertebrae: Vertebral body height maintained without acute or chronic
fracture. Bone marrow signal intensity heterogeneous without
discrete or worrisome osseous lesions. Mild reactive marrow edema
seen about the right greater than left L4-5 facets due to facet
arthritis. No other abnormal marrow edema.

Conus medullaris and cauda equina: Conus extends to the L1 level.
Conus and cauda equina appear normal.

Paraspinal and other soft tissues: Paraspinous soft tissues within
normal limits. Visualized visceral structures are normal.

Disc levels:

L1-2: Minimal annular disc bulge. Small left foraminal disc
protrusion closely approximates the exiting left L1 nerve root
without frank impingement (series 5, image 9). No spinal stenosis.
Foramina remain patent.

L2-3: Mild disc bulge with disc desiccation. Superimposed tiny
central disc protrusion minimally indents the ventral thecal sac.
Mild bilateral facet hypertrophy. No spinal stenosis. Foramina
remain patent. No impingement.

L3-4: Disc desiccation with small central annular fissure. Mild
facet hypertrophy. No spinal stenosis. Foramina remain patent.

L4-5: Trace anterolisthesis. Mild disc bulge with disc desiccation.
Moderate bilateral facet hypertrophy. Associated trace joint
effusion on the right. Resultant mild canal with bilateral lateral
recess stenosis. Mild bilateral L4 foraminal narrowing.

L5-S1: Trace retrolisthesis. Mild disc bulge with disc desiccation.
Mild bilateral facet hypertrophy. No significant spinal stenosis.
Foramina remain patent.
IMPRESSION: 1. Trace anterolisthesis of L4 on L5 with associated disc bulge and
moderate facet hypertrophy, resulting in mild canal and bilateral
lateral recess stenosis, with mild bilateral L4 foraminal narrowing.
2. Small left foraminal disc protrusion at L1-2, closely
approximating and potentially irritating the exiting left L1 nerve
root.
3. Additional mild degenerative spondylosis elsewhere within the
lumbar spine as above. No other significant stenosis or neural
impingement.

## 2020-10-17 ENCOUNTER — Other Ambulatory Visit: Payer: Self-pay

## 2020-10-17 MED ORDER — HYDROXYZINE HCL 25 MG PO TABS: ORAL_TABLET | ORAL | 0 refills | Status: DC

## 2020-10-23 ENCOUNTER — Ambulatory Visit: Payer: Medicare Other | Admitting: Allergy and Immunology

## 2020-10-24 ENCOUNTER — Other Ambulatory Visit: Payer: Self-pay | Admitting: Physician Assistant

## 2020-10-27 NOTE — Telephone Encounter (Signed)
Please get her 6 month scheduled.

## 2020-10-28 NOTE — Telephone Encounter (Signed)
Pt is scheduled 12/23/20

## 2020-11-07 ENCOUNTER — Other Ambulatory Visit: Payer: Self-pay | Admitting: Physician Assistant

## 2020-11-21 ENCOUNTER — Other Ambulatory Visit: Payer: Self-pay | Admitting: Physician Assistant

## 2020-11-25 ENCOUNTER — Other Ambulatory Visit: Payer: Self-pay | Admitting: Physician Assistant

## 2020-11-26 ENCOUNTER — Other Ambulatory Visit: Payer: Self-pay

## 2020-11-26 MED ORDER — MONTELUKAST SODIUM 10 MG PO TABS: ORAL_TABLET | ORAL | 0 refills | Status: DC

## 2020-11-27 NOTE — Telephone Encounter (Signed)
Just submitted for 30 day and has f/u 06/28

## 2020-12-19 ENCOUNTER — Other Ambulatory Visit: Payer: Self-pay | Admitting: Physician Assistant

## 2020-12-23 ENCOUNTER — Ambulatory Visit: Payer: Medicare Other | Admitting: Physician Assistant

## 2020-12-23 ENCOUNTER — Encounter: Payer: Self-pay | Admitting: Physician Assistant

## 2020-12-23 ENCOUNTER — Other Ambulatory Visit: Payer: Self-pay

## 2020-12-23 DIAGNOSIS — G2581 Restless legs syndrome: Secondary | ICD-10-CM

## 2020-12-23 DIAGNOSIS — G4700 Insomnia, unspecified: Secondary | ICD-10-CM

## 2020-12-23 DIAGNOSIS — Z79899 Other long term (current) drug therapy: Secondary | ICD-10-CM | POA: Diagnosis not present

## 2020-12-23 DIAGNOSIS — F411 Generalized anxiety disorder: Secondary | ICD-10-CM

## 2020-12-23 DIAGNOSIS — F3341 Major depressive disorder, recurrent, in partial remission: Secondary | ICD-10-CM

## 2020-12-23 MED ORDER — LITHIUM CARBONATE 150 MG PO CAPS: 450.0000 mg | ORAL_CAPSULE | Freq: Every day | ORAL | 1 refills | Status: DC

## 2020-12-23 MED ORDER — HYDROXYZINE HCL 25 MG PO TABS: 25.0000 mg | ORAL_TABLET | Freq: Two times a day (BID) | ORAL | 1 refills | Status: DC

## 2020-12-23 MED ORDER — MIRTAZAPINE 45 MG PO TABS: 45.0000 mg | ORAL_TABLET | Freq: Every day | ORAL | 1 refills | Status: DC

## 2020-12-23 MED ORDER — BUSPIRONE HCL 30 MG PO TABS: 30.0000 mg | ORAL_TABLET | Freq: Two times a day (BID) | ORAL | 1 refills | Status: DC

## 2020-12-23 NOTE — Telephone Encounter (Signed)
Has apt today 

## 2020-12-23 NOTE — Progress Notes (Signed)
[facility]  Patient ID: Natasha Cole,  MRN: [id number]  PCP: Krystal Clark, NP   Date of Evaluation: 12/23/2020 Time spent:30 minutes  Chief Complaint:  Anxiety, depression, insomnia   HISTORY/CURRENT STATUS: For routine med check.  Doing really well with current meds. Is able to enjoy things.  Denies decreased energy or motivation.  Appetite has not changed.  No extreme sadness, tearfulness, or feelings of hopelessness. Sleeps well.  Denies any changes in concentration, making decisions or remembering things.  Anxiety is well controlled with Buspar and hydroxyzine.Denies suicidal or homicidal thoughts.  Patient denies increased energy with decreased need for sleep, no increased talkativeness, no racing thoughts, no impulsivity or risky behaviors, no increased spending, no increased libido, no grandiosity, no increased irritability or anger, and no hallucinations.  Review of Systems  Constitutional: Negative.   HENT: Negative.    Eyes: Negative.   Respiratory: Negative.    Cardiovascular: Negative.   Gastrointestinal:  Positive for heartburn.  Genitourinary: Negative.   Musculoskeletal:  Positive for back pain.  Skin: Negative.   Neurological: Negative.   Endo/Heme/Allergies: Negative.   Psychiatric/Behavioral:         See HPI   Individual Medical History/ Review of Systems: Changes? :Yes   GERD. HTN, new meds for both.   Past medications for mental health diagnoses include: Lithium, Seroquel, Latuda, Abilify, Remeron, BuSpar, Effexor, Deplin, Rexulti, Risperdal, Cymbalta, Prozac, Pristiq, Lamictal, Wellbutrin, trazodone, Viibryd, Zoloft, Mirapex, Klonopin, imipramine caused rash and itching  Allergies: Imipramine  Current Medications:  Current Outpatient Medications:    amLODipine (NORVASC) 2.5 MG tablet, Take 2.5 mg by mouth daily., Disp: , Rfl:    Biotin 10 MG CAPS, , Disp: , Rfl:    cetirizine (ZYRTEC) 10 MG tablet, Take 10 mg by mouth  daily., Disp: , Rfl:    Cholecalciferol 50 MCG (2000 UT) TABS, Take by mouth., Disp: , Rfl:    Cyanocobalamin 1000 MCG CAPS, , Disp: , Rfl:    EPINEPHrine 0.3 mg/0.3 mL IJ SOAJ injection, Use as directed for life threatening allergic reactions only, Disp: 2 each, Rfl: 3   famotidine (PEPCID) 20 MG tablet, TAKE ONE TABLET BY MOUTH TWICE DAILY, Disp: 180 tablet, Rfl: 1   levothyroxine (SYNTHROID, LEVOTHROID) 50 MCG tablet, Take by mouth., Disp: , Rfl:    montelukast (SINGULAIR) 10 MG tablet, TAKE 1 TABLET BY MOUTH ONCE DAILY AS DIRECTED. GENERIC EQUIVALENT FOR SINGULAIR, Disp: 90 tablet, Rfl: 0   omeprazole (PRILOSEC) 20 MG capsule, Take 20 mg by mouth daily., Disp: , Rfl:    pramipexole (MIRAPEX) 0.125 MG tablet, TAKE 2 TABLETS BY MOUTH TWICE DAILY, Disp: 360 tablet, Rfl: 0   rosuvastatin (CRESTOR) 10 MG tablet, TAKE 1 TABLET BY MOUTH ONCE DAILY, Disp: , Rfl:    vitamin E 400 UNIT capsule, Take by mouth., Disp: , Rfl:    busPIRone (BUSPAR) 30 MG tablet, Take 1 tablet (30 mg total) by mouth 2 (two) times daily., Disp: 180 tablet, Rfl: 1   diclofenac (VOLTAREN) 75 MG EC tablet, Take 1 tablet (75 mg total) by mouth 2 (two) times daily. (Patient not taking: Reported on 12/23/2020), Disp: 60 tablet, Rfl: 2   hydrOXYzine (ATARAX/VISTARIL) 25 MG tablet, Take 1 tablet (25 mg total) by mouth 2 (two) times daily., Disp: 180 tablet, Rfl: 1   lithium carbonate 150 MG capsule, Take 3 capsules (450 mg total) by mouth daily., Disp: 270 capsule, Rfl: 1   mirtazapine (REMERON) 45 MG tablet, Take 1 tablet (45 mg  total) by mouth at bedtime., Disp: 90 tablet, Rfl: 1  Current Facility-Administered Medications:    omalizumab Geoffry Paradise) injection 300 mg, 300 mg, Subcutaneous, Q28 days, Kozlow, Alvira Philips, MD, 300 mg at 04/17/20 0930 Medication Side Effects: none  Family Medical/ Social History: Changes? No  MENTAL HEALTH EXAM:  There were no vitals taken for this visit.There is no height or weight on file to calculate  BMI.  General Appearance: Casual, Well Groomed, and Obese  Eye Contact:  Good  Speech:  Clear and Coherent  Volume:  Normal  Mood:  Euthymic  Affect:  Congruent  Thought Process:  Goal Directed and Descriptions of Associations: Intact  Orientation:  Full (Time, Place, and Person)  Thought Content: Logical   Suicidal Thoughts:  No  Homicidal Thoughts:  No  Memory:  WNL  Judgement:  Good  Insight:  Good  Psychomotor Activity:  Normal  Concentration:  Concentration: Good  Recall:  Good  Fund of Knowledge: Good  Language: Good  Assets:  Desire for Improvement  ADL's:  Intact  Cognition: WNL  Prognosis:  Good   Labs 12/03/2020 Hemoglobin A1c 5.6 TSH 5.89, PCP is treating Lipid panel total cholesterol 240, LDL 166, triglycerides 94, HDL 70.  PCP is treating. CMP BUN 18, creatinine 1.43.  Remainder of CMP values are normal. No recent lithium level.  DIAGNOSES:    ICD-10-CM   1. Recurrent major depressive disorder, in partial remission (HCC)  F33.41 Lithium level    2. Encounter for long-term (current) use of medications  Z79.899 Lithium level    3. Insomnia, unspecified type  G47.00     4. Generalized anxiety disorder  F41.1     5. Restless leg syndrome  G25.81        Receiving Psychotherapy: No    RECOMMENDATIONS:  PDMP was reviewed. I provided 40 minutes of face to face time during this encounter, including time spent before and after the visit in records review, medical decision making, and charting.  I am glad she is doing well on the current regimen.  No changes will be made. Continue BuSpar 30 mg, 1 p.o. twice daily. Continue hydroxyzine 25 mg, 1 p.o. twice daily as needed. Continue levothyroxine treated by PCP. Continue lithium 150 mg, 3 p.o. nightly. Continue mirtazapine 45 mg, 1 p.o. nightly. Continue pramipexole 0.125 mg, 2 p.o. twice daily. Lithium level, trough ordered.   Return in 6 months.  Melony Overly, PA-C

## 2020-12-28 LAB — LITHIUM LEVEL: Lithium Lvl: 0.5 mmol/L (ref 0.5–1.2)

## 2021-01-02 ENCOUNTER — Telehealth: Payer: Self-pay | Admitting: Physician Assistant

## 2021-01-02 NOTE — Telephone Encounter (Signed)
Pt left a message and said that she received her lithium results back but she doesn;t understand them. Please give her a call to go over with her at [phone]

## 2021-01-05 NOTE — Telephone Encounter (Signed)
I sent you a message on her lab report. Thanks.

## 2021-01-05 NOTE — Telephone Encounter (Signed)
I will call her once you review her labs and send me a message

## 2021-01-05 NOTE — Progress Notes (Signed)
Lithium level is good. Low normal range, but that's where it needs to be for her, and it's working. No need to increase Li dose.

## 2021-01-06 NOTE — Progress Notes (Signed)
Pt informed

## 2021-01-14 ENCOUNTER — Other Ambulatory Visit: Payer: Self-pay | Admitting: *Deleted

## 2021-01-14 MED ORDER — FAMOTIDINE 20 MG PO TABS: ORAL_TABLET | ORAL | 0 refills | Status: DC

## 2021-01-30 ENCOUNTER — Other Ambulatory Visit: Payer: Self-pay | Admitting: Physician Assistant

## 2021-02-02 ENCOUNTER — Other Ambulatory Visit: Payer: Self-pay | Admitting: Physician Assistant

## 2021-04-17 ENCOUNTER — Other Ambulatory Visit: Payer: Self-pay | Admitting: *Deleted

## 2021-04-17 MED ORDER — FAMOTIDINE 20 MG PO TABS: ORAL_TABLET | ORAL | 0 refills | Status: DC

## 2021-04-30 ENCOUNTER — Other Ambulatory Visit: Payer: Self-pay | Admitting: Physician Assistant

## 2021-06-04 ENCOUNTER — Other Ambulatory Visit: Payer: Self-pay

## 2021-06-04 MED ORDER — HYDROXYZINE HCL 25 MG PO TABS: 25.0000 mg | ORAL_TABLET | Freq: Two times a day (BID) | ORAL | 1 refills | Status: DC

## 2021-06-11 ENCOUNTER — Other Ambulatory Visit: Payer: Self-pay | Admitting: Physician Assistant

## 2021-06-24 ENCOUNTER — Ambulatory Visit: Payer: Medicare Other | Admitting: Physician Assistant

## 2021-06-24 ENCOUNTER — Other Ambulatory Visit: Payer: Self-pay

## 2021-06-24 ENCOUNTER — Encounter: Payer: Self-pay | Admitting: Physician Assistant

## 2021-06-24 DIAGNOSIS — F3341 Major depressive disorder, recurrent, in partial remission: Secondary | ICD-10-CM

## 2021-06-24 DIAGNOSIS — G4700 Insomnia, unspecified: Secondary | ICD-10-CM | POA: Diagnosis not present

## 2021-06-24 DIAGNOSIS — F411 Generalized anxiety disorder: Secondary | ICD-10-CM

## 2021-06-24 DIAGNOSIS — G2581 Restless legs syndrome: Secondary | ICD-10-CM | POA: Diagnosis not present

## 2021-06-24 DIAGNOSIS — Z79899 Other long term (current) drug therapy: Secondary | ICD-10-CM

## 2021-06-24 MED ORDER — LITHIUM CARBONATE 150 MG PO CAPS: 450.0000 mg | ORAL_CAPSULE | Freq: Every day | ORAL | 1 refills | Status: DC

## 2021-06-24 MED ORDER — PRAMIPEXOLE DIHYDROCHLORIDE 0.125 MG PO TABS: 0.2500 mg | ORAL_TABLET | Freq: Two times a day (BID) | ORAL | 1 refills | Status: DC

## 2021-06-24 MED ORDER — MIRTAZAPINE 45 MG PO TABS: 45.0000 mg | ORAL_TABLET | Freq: Every day | ORAL | 1 refills | Status: DC

## 2021-06-24 NOTE — Progress Notes (Signed)
[facility]  Patient ID: Natasha Cole,  MRN: [id number]  PCP: Mayer Camel, NP   Date of Evaluation: 06/24/2021 Time spent:40 minutes  Chief Complaint:  anxiety, depression, insomnia   HISTORY/CURRENT STATUS: For routine med check.  She's doing well. Only problem is not sleeping well some nights, but it's b/c of back pain. "It's not b/c of my mental health." Enjoys spending time with her grandkids. Doesn't work outside the home, helps out with them. Denies decreased energy or motivation.  Appetite has not changed.  No extreme sadness, tearfulness, or feelings of hopelessness.  Denies any changes in concentration, making decisions or remembering things.  Anxiety is well controlled most of the time.  Denies suicidal or homicidal thoughts.  Patient denies increased energy with decreased need for sleep, no increased talkativeness, no racing thoughts, no impulsivity or risky behaviors, no increased spending, no increased libido, no grandiosity, no increased irritability or anger, no paranoia, and no hallucinations.  Review of Systems  Constitutional: Negative.   HENT: Negative.    Eyes: Negative.   Respiratory: Negative.    Cardiovascular: Negative.   Gastrointestinal: Negative.   Genitourinary: Negative.   Musculoskeletal: Negative.   Skin: Negative.   Neurological: Negative.   Endo/Heme/Allergies: Negative.   Psychiatric/Behavioral:         See HPI.    Individual Medical History/ Review of Systems: Changes? :Yes   having injections in her lumbar spine for chronic back pain Iron deficiency anemia  Past medications for mental health diagnoses include: Lithium, Seroquel, Latuda, Abilify, Remeron, BuSpar, Effexor, Deplin, Rexulti, Risperdal, Cymbalta, Prozac, Pristiq, Lamictal, Wellbutrin, trazodone, Viibryd, Zoloft, Mirapex, Klonopin, imipramine caused rash and itching  Allergies: Imipramine and Nsaids  Current Medications:  Current Outpatient  Medications:    amLODipine (NORVASC) 2.5 MG tablet, Take 2.5 mg by mouth daily., Disp: , Rfl:    Biotin 10 MG CAPS, , Disp: , Rfl:    busPIRone (BUSPAR) 30 MG tablet, TAKE 1 TABLET BY MOUTH TWICE DAILY, GENERIC EQUIVALENT FOR BUSPAR, Disp: 180 tablet, Rfl: 1   Cholecalciferol 50 MCG (2000 UT) TABS, Take by mouth., Disp: , Rfl:    Cyanocobalamin 1000 MCG CAPS, , Disp: , Rfl:    EPINEPHrine 0.3 mg/0.3 mL IJ SOAJ injection, Use as directed for life threatening allergic reactions only, Disp: 2 each, Rfl: 3   hydrOXYzine (ATARAX) 25 MG tablet, Take 1 tablet (25 mg total) by mouth 2 (two) times daily., Disp: 180 tablet, Rfl: 1   levothyroxine (SYNTHROID, LEVOTHROID) 50 MCG tablet, Take by mouth., Disp: , Rfl:    montelukast (SINGULAIR) 10 MG tablet, TAKE 1 TABLET BY MOUTH ONCE DAILY AS DIRECTED. GENERIC EQUIVALENT FOR SINGULAIR, Disp: 90 tablet, Rfl: 0   omeprazole (PRILOSEC) 20 MG capsule, Take 20 mg by mouth daily., Disp: , Rfl:    rosuvastatin (CRESTOR) 10 MG tablet, TAKE 1 TABLET BY MOUTH ONCE DAILY, Disp: , Rfl:    tolterodine (DETROL) 2 MG tablet, Take 2 mg by mouth 2 (two) times daily., Disp: , Rfl:    vitamin E 400 UNIT capsule, Take by mouth., Disp: , Rfl:    diclofenac (VOLTAREN) 75 MG EC tablet, Take 1 tablet (75 mg total) by mouth 2 (two) times daily. (Patient not taking: Reported on 12/23/2020), Disp: 60 tablet, Rfl: 2   famotidine (PEPCID) 20 MG tablet, TAKE ONE TABLET BY MOUTH TWICE DAILY (Patient not taking: Reported on 06/24/2021), Disp: 60 tablet, Rfl: 0   lithium carbonate 150 MG  capsule, Take 3 capsules (450 mg total) by mouth daily., Disp: 270 capsule, Rfl: 1   mirtazapine (REMERON) 45 MG tablet, Take 1 tablet (45 mg total) by mouth at bedtime., Disp: 90 tablet, Rfl: 1   pramipexole (MIRAPEX) 0.125 MG tablet, Take 2 tablets (0.25 mg total) by mouth 2 (two) times daily., Disp: 360 tablet, Rfl: 1  Current Facility-Administered Medications:    omalizumab Arvid Right) injection 300 mg, 300  mg, Subcutaneous, Q28 days, Kozlow, Donnamarie Poag, MD, 300 mg at 04/17/20 0930 Medication Side Effects: none  Family Medical/ Social History: Changes? No  MENTAL HEALTH EXAM:  There were no vitals taken for this visit.There is no height or weight on file to calculate BMI.  General Appearance: Casual, Well Groomed, and Obese  Eye Contact:  Good  Speech:  Clear and Coherent  Volume:  Normal  Mood:  Euthymic  Affect:  Congruent  Thought Process:  Goal Directed and Descriptions of Associations: Circumstantial  Orientation:  Full (Time, Place, and Person)  Thought Content: Logical   Suicidal Thoughts:  No  Homicidal Thoughts:  No  Memory:  WNL  Judgement:  Good  Insight:  Good  Psychomotor Activity:  Normal  Concentration:  Concentration: Good  Recall:  Good  Fund of Knowledge: Good  Language: Good  Assets:  Desire for Improvement  ADL's:  Intact  Cognition: WNL  Prognosis:  Good   Labs 06/04/2021 Hgb A1C 5.8 ESR 34 CRP <5 RA negative  05/12/2021 CBC WBC 6.9, hemoglobin 14.7, hematocrit 45.7, platelets 324 CMP BUN 12, creatinine 1.48, GFR was 43 Lipid panel see on chart TSH 3.63  12/26/2020  lithium level 0.5  DIAGNOSES:    ICD-10-CM   1. Recurrent major depressive disorder, in partial remission (HCC)  F33.41 Lithium level    Comprehensive metabolic panel    2. Insomnia, unspecified type  G47.00     3. Restless leg syndrome  G25.81     4. Generalized anxiety disorder  F41.1     5. Encounter for long-term (current) use of medications  Z79.899 Lithium level    Comprehensive metabolic panel       Receiving Psychotherapy: No    RECOMMENDATIONS:  PDMP was reviewed.  No controlled substances since 11/19/2020. I provided 40 minutes of face to face time during this encounter, including time spent before and after the visit in records review, medical decision making, counseling pertinent to today's visit, and charting.  We discussed her labs, particularly the slightly  elevated creatinine and decreasing GFR.  I recommend having that, along with a lithium level rechecked sometime within the next 6 weeks or so.  It is not urgent but it does not need to be overlooked.  I did explain that lithium can sometimes affect the kidneys, not to scare her but just to convey the importance of getting labs drawn.  She should drink plenty of fluids daily, as the most common reason for an elevated kidney function test is dehydration.  She verbalizes understanding. No change in medications are necessary at this time. Continue BuSpar 30 mg, 1 p.o. twice daily. Continue hydroxyzine 25 mg, 1 p.o. twice daily as needed. Continue levothyroxine Rx by PCP. Continue lithium 150 mg, 3 p.o. nightly. Continue mirtazapine 45 mg, 1 p.o. nightly. Continue pramipexole 0.125 mg, 2 p.o. twice daily. Lithium level and CMP to be drawn no longer than 6 weeks.  If she has an appointment with her PCP coming up within that timeframe she can wait and have it done  then.  If not, go ahead and go to [facility].  She verbalizes understanding. Return in 6 months.  Donnal Moat, PA-C

## 2021-06-25 DIAGNOSIS — I4729 Other ventricular tachycardia: Secondary | ICD-10-CM

## 2021-06-25 HISTORY — DX: Other ventricular tachycardia: I47.29

## 2021-06-26 LAB — COMPREHENSIVE METABOLIC PANEL
ALT: 9 IU/L (ref 0–32)
AST: 14 IU/L (ref 0–40)
Albumin/Globulin Ratio: 2.2 (ref 1.2–2.2)
Albumin: 4.2 g/dL (ref 3.8–4.9)
Alkaline Phosphatase: 96 IU/L (ref 44–121)
BUN/Creatinine Ratio: 7 — ABNORMAL LOW (ref 9–23)
BUN: 10 mg/dL (ref 6–24)
Bilirubin Total: 0.4 mg/dL (ref 0.0–1.2)
CO2: 23 mmol/L (ref 20–29)
Calcium: 9.5 mg/dL (ref 8.7–10.2)
Chloride: 107 mmol/L — ABNORMAL HIGH (ref 96–106)
Creatinine, Ser: 1.38 mg/dL — ABNORMAL HIGH (ref 0.57–1.00)
Globulin, Total: 1.9 g/dL (ref 1.5–4.5)
Glucose: 100 mg/dL — ABNORMAL HIGH (ref 70–99)
Potassium: 4.8 mmol/L (ref 3.5–5.2)
Sodium: 140 mmol/L (ref 134–144)
Total Protein: 6.1 g/dL (ref 6.0–8.5)
eGFR: 46 mL/min/{1.73_m2} — ABNORMAL LOW (ref >59)

## 2021-06-26 LAB — LITHIUM LEVEL: Lithium Lvl: 0.5 mmol/L (ref 0.5–1.2)

## 2021-06-26 NOTE — Progress Notes (Signed)
Please let her know that her lithium level is 0.5 which is good.  Stay on the same dose. Her kidney function tests show the GFR as 46, it was 44 when she saw the nephrologist 05/07/2021.  The creatinine level is 1.38, it was 1.4 when she saw the nephrologist.  Her levels are very stable and there is no reason to go off the lithium.

## 2021-06-26 NOTE — Progress Notes (Signed)
Patient notified of results and recommendations.

## 2021-08-12 HISTORY — PX: CARDIAC CATHETERIZATION: SHX172

## 2021-08-28 ENCOUNTER — Other Ambulatory Visit: Payer: Self-pay | Admitting: Neurosurgery

## 2021-08-28 DIAGNOSIS — M4310 Spondylolisthesis, site unspecified: Secondary | ICD-10-CM

## 2021-09-13 ENCOUNTER — Ambulatory Visit
Admission: RE | Admit: 2021-09-13 | Discharge: 2021-09-13 | Disposition: A | Discharge status: Home / Self Care | Payer: Medicare Other | Source: Ambulatory Visit | Attending: Neurosurgery | Admitting: Neurosurgery

## 2021-09-13 ENCOUNTER — Other Ambulatory Visit: Payer: Self-pay

## 2021-09-13 DIAGNOSIS — M4310 Spondylolisthesis, site unspecified: Secondary | ICD-10-CM

## 2021-09-13 IMAGING — MR MR LUMBAR SPINE W/O CM
4 of 5 series · 26 of 48 positions shown · non-contrast
Comparison: One year prior

CLINICAL DATA: Mid to low back pain with weakness of both legs.
Numbness of right leg

EXAM:
MRI LUMBAR SPINE WITHOUT CONTRAST
TECHNIQUE: Multiplanar, multisequence MR imaging of the lumbar spine was
performed. No intravenous contrast was administered.

[Series 3: T2 · sagittal · 4.0mm · 0.59mm/px · 6 of 17 slices shown (1 of 2)]
[im 1/17]
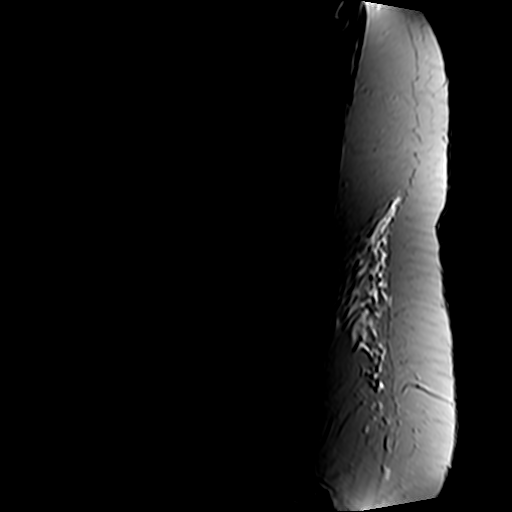
[im 4/17]
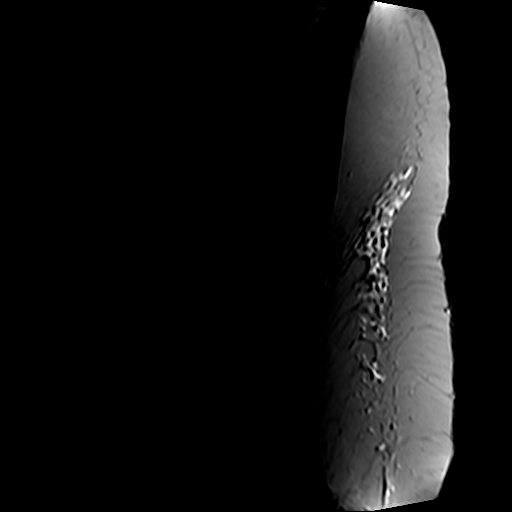
[im 7/17]
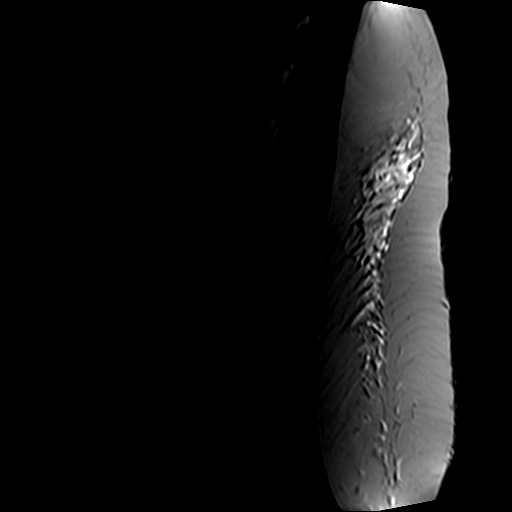
[im 10/17]
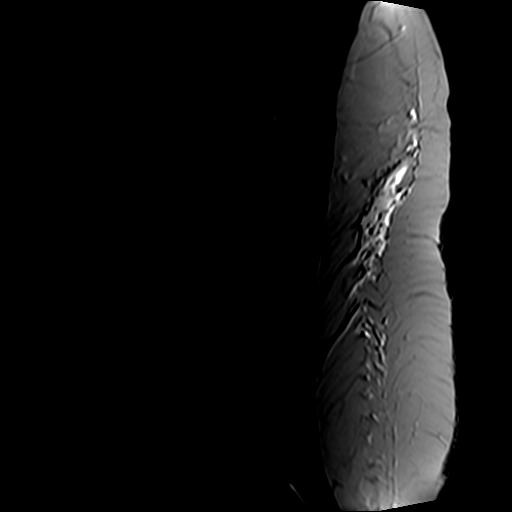
[im 13/17]
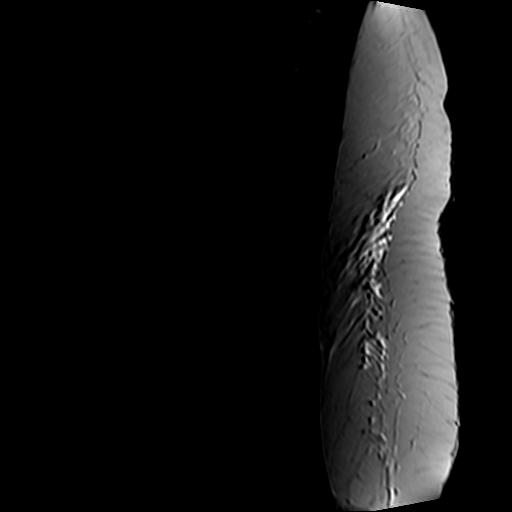
[im 17/17]
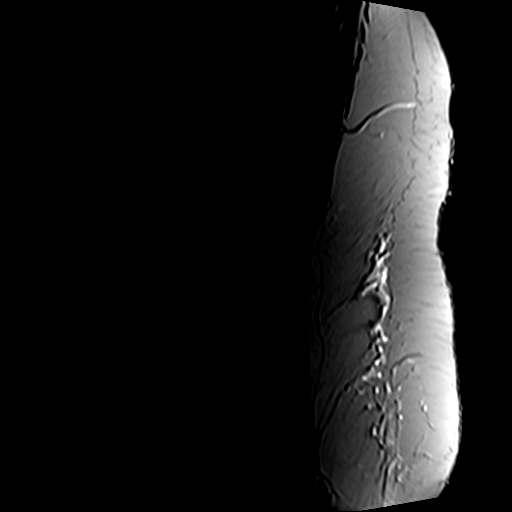

[Series 5: T1 · sagittal · 4.0mm · 0.59mm/px · 6 of 17 slices shown (1 of 2)]
[im 1/17]
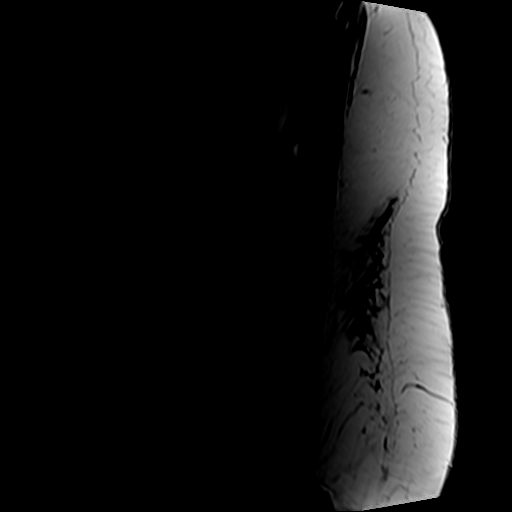
[im 4/17]
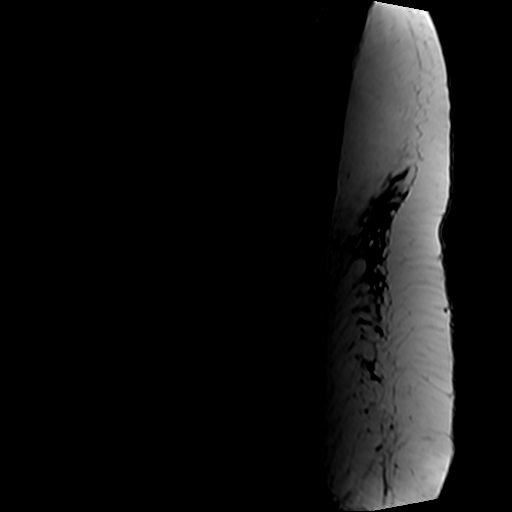
[im 7/17]
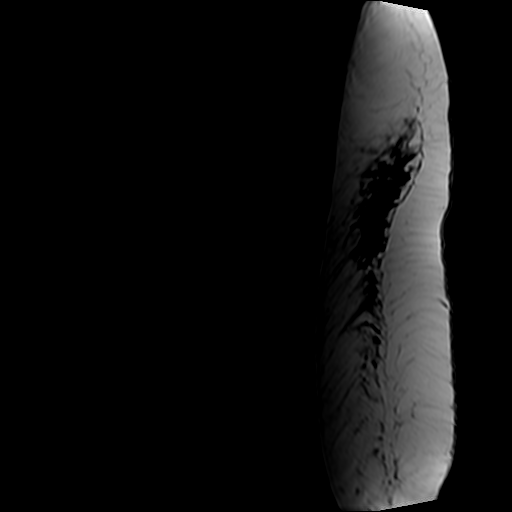
[im 10/17]
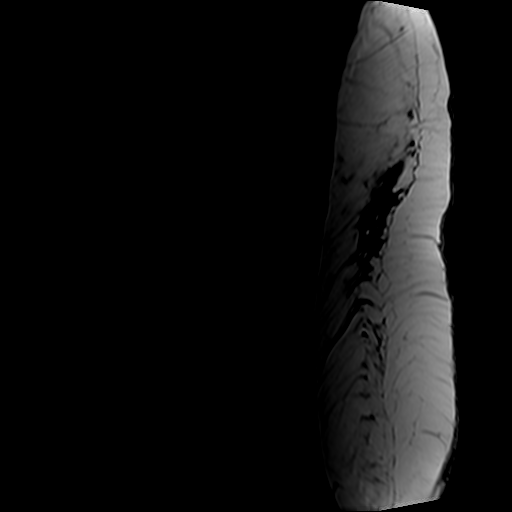
[im 13/17]
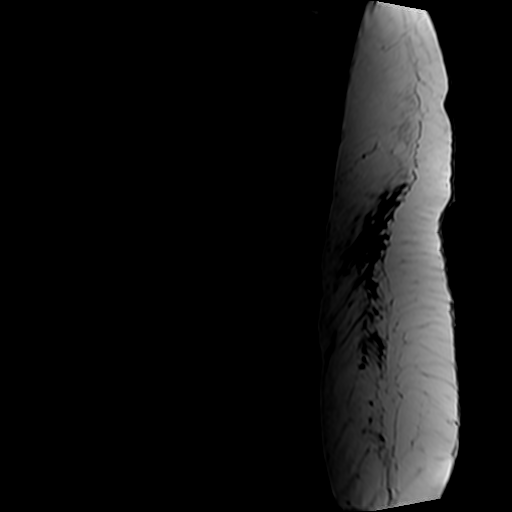
[im 17/17]
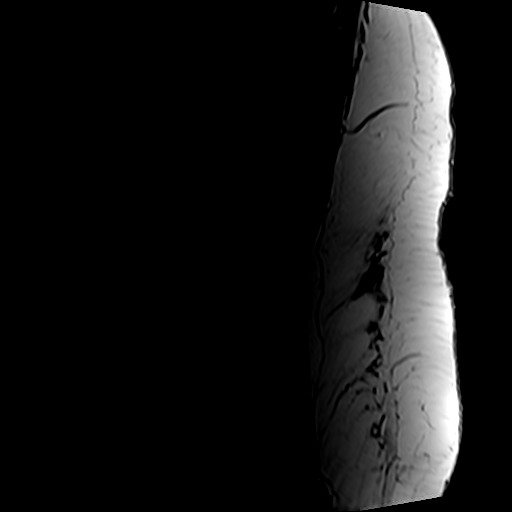

[Series 6: T2 · axial · 4.0mm · 0.78mm/px · z∈[-91,+151]mm · 9 of 46 slices shown (2 of 2)]
[im 1/46]
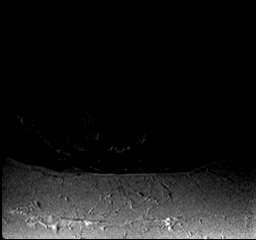
[im 7/46]
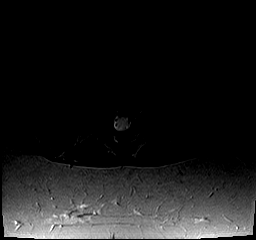
[im 13/46]
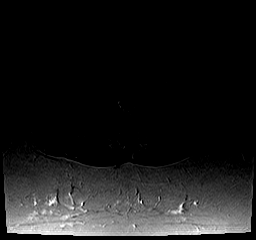
[im 20/46]
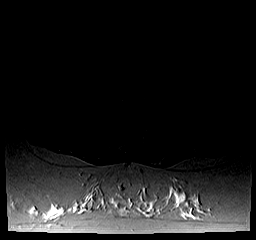
[im 23/46]
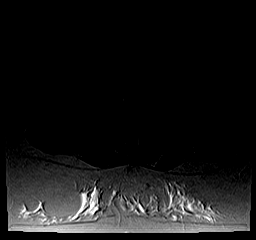
[im 26/46]
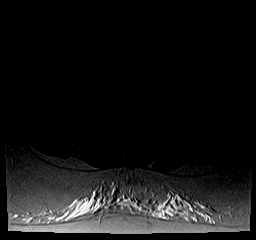
[im 33/46]
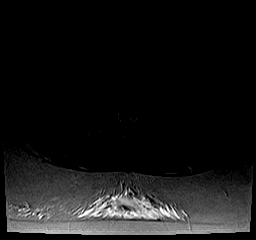
[im 39/46]
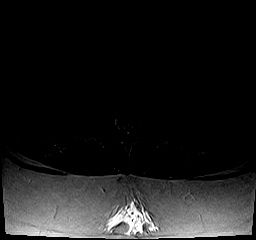
[im 46/46]
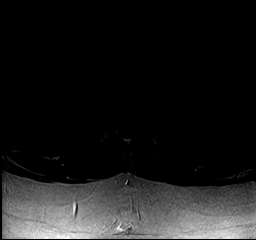

[Series 7: T1 · axial · 4.0mm · 0.39mm/px · z∈[-91,+115]mm · 5 of 46 slices shown (2 of 2)]
[im 1/46]
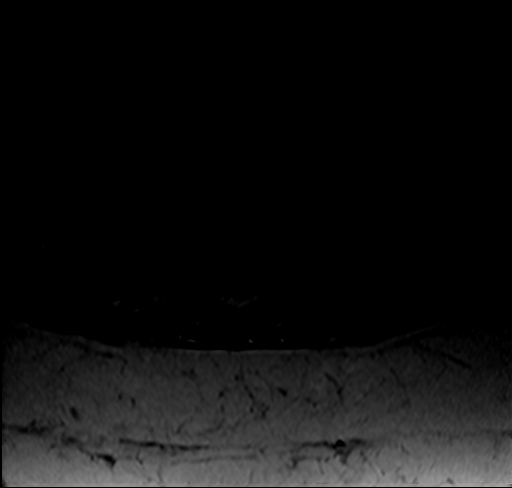
[im 7/46]
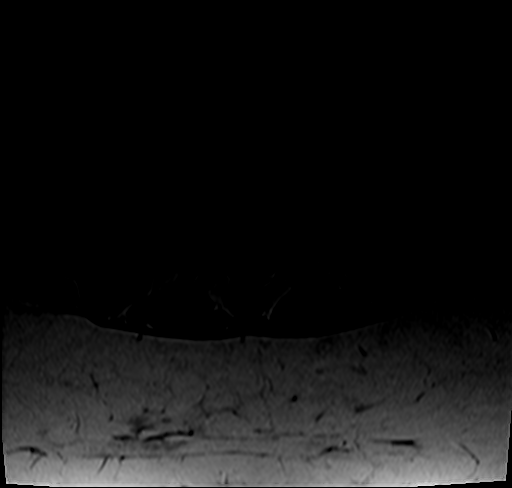
[im 13/46]
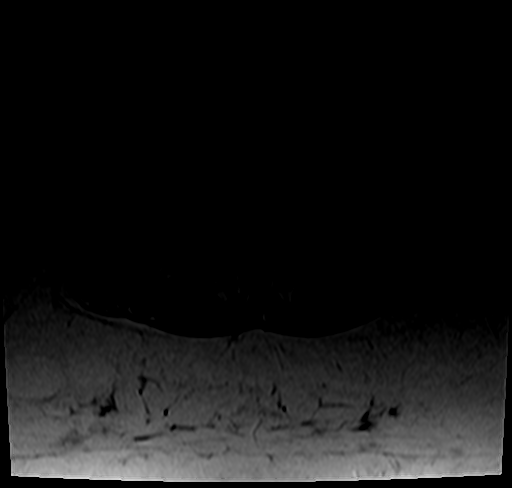
[im 23/46]
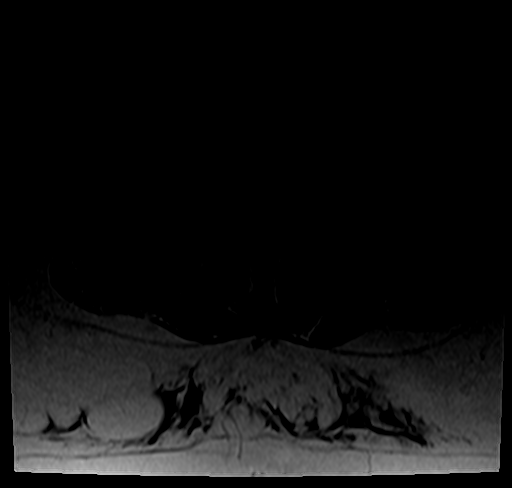
[im 39/46]
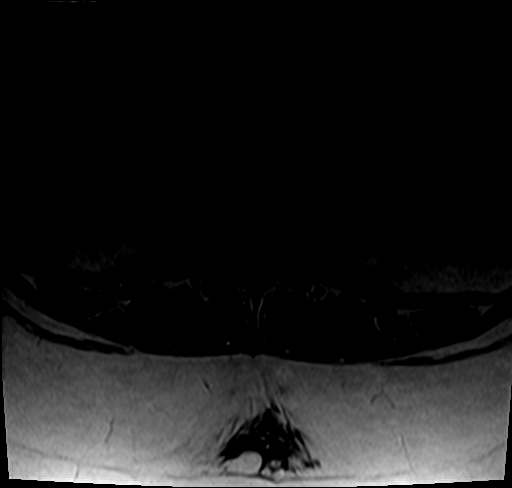

[26 of 48 positions shown; findings below may reference images not displayed]

FINDINGS: Segmentation:  5 lumbar type vertebrae

Alignment:  Mild degenerative anterolisthesis at L4-5

Vertebrae:  No fracture, evidence of discitis, or bone lesion.

Conus medullaris and cauda equina: Conus extends to the L1 level.
Conus and cauda equina appear normal.

Paraspinal and other soft tissues: Negative for perispinal mass or
inflammation

Disc levels:

T12- L1: Unremarkable.

L1-L2: Small left inferior foraminal protrusion also seen on prior.
No neural compression.

L2-L3: Borderline facet spurring

L3-L4: Mild facet spurring and ligamentous thickening

L4-L5: Degenerative facet spurring with small right joint effusion
and mild anterolisthesis. Mild disc space narrowing. No neural
compression

L5-S1:Mild facet spurring on the right. No herniation or neural
impingement
IMPRESSION: 1. Stable compared to 1 year prior.
2. Facet degeneration greatest at L4-5 where there is mild
anterolisthesis.
3. No neural compression.

## 2021-09-17 ENCOUNTER — Other Ambulatory Visit: Payer: Self-pay | Admitting: Neurosurgery

## 2021-09-23 NOTE — Pre-Procedure Instructions (Signed)
Surgical Instructions ? ? ? Your procedure is scheduled on Monday, April 3rd. ? Report to [facility] Main Entrance "A" at 10:00 A.M., then check in with the Admitting office. ? Call this number if you have problems the morning of surgery: ? [phone] ? ? If you have any questions prior to your surgery date call [phone]: Open Monday-Friday 8am-4pm ? ? ? Remember: ? Do not eat or drink after midnight the night before your surgery ? ?  ? Take these medicines the morning of surgery with A SIP OF WATER  ?amLODipine (NORVASC) ?busPIRone (BUSPAR) ?levothyroxine (SYNTHROID, LEVOTHROID) ?lithium carbonate ?medroxyPROGESTERone (PROVERA) ?metoprolol succinate (TOPROL-XL)  ?omeprazole (PRILOSEC)  ?pramipexole (MIRAPEX) ?rosuvastatin (CRESTOR)  ?tolterodine (DETROL) ? ? ?As of today, STOP taking any Aspirin (unless otherwise instructed by your surgeon) Aleve, Naproxen, Ibuprofen, Motrin, Advil, Goody's, BC's, all herbal medications, fish oil, and all vitamins. ?         ?           ?Do NOT Smoke (Tobacco/Vaping) for 24 hours prior to your procedure. ? ?If you use a CPAP at night, you may bring your mask/headgear for your overnight stay. ?  ?Contacts, glasses, piercing's, hearing aid's, dentures or partials may not be worn into surgery, please bring cases for these belongings.  ?  ?For patients admitted to the hospital, discharge time will be determined by your treatment team. ?  ?Patients discharged the day of surgery will not be allowed to drive home, and someone needs to stay with them for 24 hours. ? ?SURGICAL WAITING ROOM VISITATION ?Patients having surgery or a procedure may have two support people in the waiting room. These visitors may be switched out with other visitors if needed. ?Children under the age of 31 must have an adult accompany them who is not the patient. ?If the patient needs to stay at the hospital during part of their recovery, the visitor guidelines for inpatient rooms apply. ? ?Please refer to  the [facility] website for the visitor guidelines for Inpatients (after your surgery is over and you are in a regular room).  ? ? ?Special instructions:   ?[place]- Preparing For Surgery ? ?Before surgery, you can play an important role. Because skin is not sterile, your skin needs to be as free of germs as possible. You can reduce the number of germs on your skin by washing with CHG (chlorahexidine gluconate) Soap before surgery.  CHG is an antiseptic cleaner which kills germs and bonds with the skin to continue killing germs even after washing.   ? ?Oral Hygiene is also important to reduce your risk of infection.  Remember - BRUSH YOUR TEETH THE MORNING OF SURGERY WITH YOUR REGULAR TOOTHPASTE ? ?Please do not use if you have an allergy to CHG or antibacterial soaps. If your skin becomes reddened/irritated stop using the CHG.  ?Do not shave (including legs and underarms) for at least 48 hours prior to first CHG shower. It is OK to shave your face. ? ?Please follow these instructions carefully. ?  ?Shower the NIGHT BEFORE SURGERY and the MORNING OF SURGERY ? ?If you chose to wash your hair, wash your hair first as usual with your normal shampoo. ? ?After you shampoo, rinse your hair and body thoroughly to remove the shampoo. ? ?Use CHG Soap as you would any other liquid soap. You can apply CHG directly to the skin and wash gently with a scrungie or a clean washcloth.  ? ?Apply the CHG Soap to your  body ONLY FROM THE NECK DOWN.  Do not use on open wounds or open sores. Avoid contact with your eyes, ears, mouth and genitals (private parts). Wash Face and genitals (private parts)  with your normal soap.  ? ?Wash thoroughly, paying special attention to the area where your surgery will be performed. ? ?Thoroughly rinse your body with warm water from the neck down. ? ?DO NOT shower/wash with your normal soap after using and rinsing off the CHG Soap. ? ?Pat yourself dry with a CLEAN TOWEL. ? ?Wear CLEAN PAJAMAS to bed  the night before surgery ? ?Place CLEAN SHEETS on your bed the night before your surgery ? ?DO NOT SLEEP WITH PETS. ? ? ?Day of Surgery: ?Take a shower with CHG soap. ?Do not wear jewelry or makeup ?Do not wear lotions, powders, perfumes, or deodorant. ?Do not shave 48 hours prior to surgery.   ?Do not bring valuables to the hospital.  ?[facility] is not responsible for any belongings or valuables. ?Do not wear nail polish, gel polish, artificial nails, or any other type of covering on natural nails (fingers and toes) ?If you have artificial nails or gel coating that need to be removed by a nail salon, please have this removed prior to surgery. Artificial nails or gel coating may interfere with anesthesia's ability to adequately monitor your vital signs. ? ?Wear Clean/Comfortable clothing the morning of surgery ?Remember to brush your teeth WITH YOUR REGULAR TOOTHPASTE. ?  ?Please read over the following fact sheets that you were given. ? ? ? ?If you received a COVID test during your pre-op visit  it is requested that you wear a mask when out in public, stay away from anyone that may not be feeling well and notify your surgeon if you develop symptoms. If you have been in contact with anyone that has tested positive in the last 10 days please notify you surgeon.  ?

## 2021-09-24 ENCOUNTER — Encounter (HOSPITAL_COMMUNITY)
Admission: RE | Admit: 2021-09-24 | Discharge: 2021-09-24 | Disposition: A | Discharge status: Home / Self Care | Payer: Medicare Other | Source: Ambulatory Visit | Attending: Neurosurgery | Admitting: Neurosurgery

## 2021-09-24 ENCOUNTER — Other Ambulatory Visit: Payer: Self-pay

## 2021-09-24 ENCOUNTER — Encounter (HOSPITAL_COMMUNITY): Payer: Self-pay | Admitting: *Deleted

## 2021-09-24 VITALS — BP 159/97 | HR 83 | Temp 97.8°F | Resp 18 | Ht 63.0 in | Wt 350.1 lb

## 2021-09-24 DIAGNOSIS — Z01812 Encounter for preprocedural laboratory examination: Secondary | ICD-10-CM | POA: Diagnosis not present

## 2021-09-24 DIAGNOSIS — N1832 Chronic kidney disease, stage 3b: Secondary | ICD-10-CM | POA: Insufficient documentation

## 2021-09-24 DIAGNOSIS — Z01818 Encounter for other preprocedural examination: Secondary | ICD-10-CM

## 2021-09-24 DIAGNOSIS — I4729 Other ventricular tachycardia: Secondary | ICD-10-CM | POA: Diagnosis not present

## 2021-09-24 DIAGNOSIS — G4733 Obstructive sleep apnea (adult) (pediatric): Secondary | ICD-10-CM | POA: Diagnosis not present

## 2021-09-24 HISTORY — DX: Gastro-esophageal reflux disease without esophagitis: K21.9

## 2021-09-24 HISTORY — DX: Other intervertebral disc degeneration, lumbar region without mention of lumbar back pain or lower extremity pain: M51.369

## 2021-09-24 HISTORY — DX: Ventricular premature depolarization: I49.3

## 2021-09-24 HISTORY — DX: Chronic kidney disease, unspecified: N18.9

## 2021-09-24 HISTORY — DX: Unspecified osteoarthritis, unspecified site: M19.90

## 2021-09-24 HISTORY — DX: Prediabetes: R73.03

## 2021-09-24 HISTORY — DX: Panic disorder (episodic paroxysmal anxiety): F41.0

## 2021-09-24 HISTORY — DX: Sleep apnea, unspecified: G47.30

## 2021-09-24 HISTORY — DX: Personal history of urinary calculi: Z87.442

## 2021-09-24 HISTORY — DX: Anemia, unspecified: D64.9

## 2021-09-24 HISTORY — DX: Other intervertebral disc degeneration, lumbar region: M51.36

## 2021-09-24 LAB — BASIC METABOLIC PANEL
Anion gap: 6 (ref 5–15)
BUN: 12 mg/dL (ref 6–20)
CO2: 25 mmol/L (ref 22–32)
Calcium: 9.3 mg/dL (ref 8.9–10.3)
Chloride: 110 mmol/L (ref 98–111)
Creatinine, Ser: 1.33 mg/dL — ABNORMAL HIGH (ref 0.44–1.00)
GFR, Estimated: 48 mL/min — ABNORMAL LOW (ref >60)
Glucose, Bld: 116 mg/dL — ABNORMAL HIGH (ref 70–99)
Potassium: 4 mmol/L (ref 3.5–5.1)
Sodium: 141 mmol/L (ref 135–145)

## 2021-09-24 LAB — CBC
HCT: 46.7 % — ABNORMAL HIGH (ref 36.0–46.0)
Hemoglobin: 14.2 g/dL (ref 12.0–15.0)
MCH: 27.5 pg (ref 26.0–34.0)
MCHC: 30.4 g/dL (ref 30.0–36.0)
MCV: 90.5 fL (ref 80.0–100.0)
Platelets: 361 10*3/uL (ref 150–400)
RBC: 5.16 MIL/uL — ABNORMAL HIGH (ref 3.87–5.11)
RDW: 16 % — ABNORMAL HIGH (ref 11.5–15.5)
WBC: 8.7 10*3/uL (ref 4.0–10.5)
nRBC: 0 % (ref 0.0–0.2)

## 2021-09-24 LAB — TYPE AND SCREEN
ABO/RH(D): O POS
Antibody Screen: NEGATIVE

## 2021-09-24 LAB — SURGICAL PCR SCREEN
MRSA, PCR: NEGATIVE
Staphylococcus aureus: NEGATIVE

## 2021-09-24 NOTE — Progress Notes (Signed)
PCP - Edyth Gunnels, NP [facility] ?Cardiologist - Dr. Milana Huntsman- records requested ? ?PPM/ICD - denies ? ? ?Chest x-ray - 08/07/21 ?EKG - 05/14/21 ?Stress Test - 07/28/21 ?ECHO - 06/04/21 ?Cardiac Cath - 08/12/21 ? ?Sleep Study - 20+ years ago per pt, OSA+ ?CPAP - no, pt says she can't tolerate ? ?DM- denies ? ?ASA/Blood Thinner Instructions: n/a ? ? ?ERAS Protcol - no, NPO ? ? ?COVID TEST- n/a ? ? ?Anesthesia review: yes, cardiac hx ? ?Patient denies shortness of breath, fever, cough and chest pain at PAT appointment ? ? ?All instructions explained to the patient, with a verbal understanding of the material. Patient agrees to go over the instructions while at home for a better understanding. Patient also instructed to notify surgeon if any contact with COVID+ person or if she develops any symptoms. The opportunity to ask questions was provided. ?  ?

## 2021-09-25 NOTE — Progress Notes (Signed)
Anesthesia Chart Review: ? ?Patient follows with cardiology at [facility] for history of HLD, NSVT (two 4 beat runs on monitor 05/2021), PVCs (22% PVC burden by monitor 05/2021, asymptomatic on metoprolol), OSA intolerant to CPAP.  Echo 05/2021 showed normal LV systolic function, no significant valvular abnormalities.  She recently had abnormal nuclear stress 07/28/2021 that was followed up by cardiac catheterization 08/12/2021 which showed normal coronaries.  Stress test was deemed to be false positive.  She was last seen 09/03/2021 and noted to be doing well at that time from a cardiac standpoint, no issues postcatheterization.  No changes made to management, advised to follow-up in 4 months. ? ?CKD stage IIIb. ? ?Preop labs reviewed, creatinine mildly elevated 1.33, labs otherwise unremarkable. ? ?EKG 05/14/2021 (copy on chart): Sinus rhythm with premature supraventricular complexes.  Rate 85.  Low voltage QRS, consider pulmonary disease or obesity. ? ?Cath 08/12/2021 (Care Everywhere): ?CONCLUSIONS:    ?1.  Normal coronary arteries.  ?2.  Left ventriculography not done to conserve contrast.  Also she began  ?to have right radial artery spasm and I did not want to manipulate  ?catheter into ventricle as it would aggravate the spasm.  ?3. RRA access.  Some radial artery spasm, treated with verapamil.   ? ?TTE 06/04/2021: ?SUMMARY  ?The left ventricular size is normal.  ?Mild left ventricular hypertrophy  ?Left ventricular systolic function is normal.  ?LV ejection fraction = 60-65%.  ?The right ventricle is normal in size and function.  ?There was insufficient TR detected to calculate RV systolic pressure.  ?The IVC is normal in size with an inspiratory collapse of greater then  ?50%, suggesting normal right atrial pressure.  ?There is no significant valvular stenosis or regurgitation.  ?There is no pericardial effusion.  ?There is no comparison study available.   ? ? ? ? ?Natasha Poles, PA-C ?[facility] Short Stay  [facility]/Anesthesiology ?Phone [phone] ?09/25/2021 9:20 AM ? ?

## 2021-09-25 NOTE — Anesthesia Preprocedure Evaluation (Addendum)
Anesthesia Evaluation  ?Patient identified by MRN, date of birth, ID band ?Patient awake ? ? ? ?Reviewed: ?Allergy & Precautions, NPO status , Patient's Chart, lab work & pertinent test results ? ?Airway ?Mallampati: II ? ?TM Distance: >3 FB ?Neck ROM: Full ? ? ? Dental ?no notable dental hx. ? ?  ?Pulmonary ?sleep apnea ,  ?  ?breath sounds clear to auscultation ?+ decreased breath sounds ? ? ? ? ? Cardiovascular ?hypertension, Pt. on medications and Pt. on home beta blockers ?Normal cardiovascular exam ?Rhythm:Regular Rate:Normal ? ? ?  ?Neuro/Psych ?negative neurological ROS ? negative psych ROS  ? GI/Hepatic ?Neg liver ROS, GERD  Medicated,  ?Endo/Other  ?Hypothyroidism Morbid obesity ? Renal/GU ?Renal InsufficiencyRenal disease  ?negative genitourinary ?  ?Musculoskeletal ?negative musculoskeletal ROS ?(+)  ? Abdominal ?(+) + obese,   ?Peds ?negative pediatric ROS ?(+)  Hematology ?negative hematology ROS ?(+)   ?Anesthesia Other Findings ? ? Reproductive/Obstetrics ?negative OB ROS ? ?  ? ? ? ? ? ? ? ? ? ? ? ? ? ?  ?  ? ? ? ? ? ? ? ? ?Anesthesia Physical ?Anesthesia Plan ? ?ASA: 3 ? ?Anesthesia Plan: General  ? ?Post-op Pain Management: Dilaudid IV  ? ?Induction: Intravenous ? ?PONV Risk Score and Plan: 3 and Ondansetron, Dexamethasone and Treatment may vary due to age or medical condition ? ?Airway Management Planned: Oral ETT ? ?Additional Equipment:  ? ?Intra-op Plan:  ? ?Post-operative Plan: Extubation in OR ? ?Informed Consent: I have reviewed the patients History and Physical, chart, labs and discussed the procedure including the risks, benefits and alternatives for the proposed anesthesia with the patient or authorized representative who has indicated his/her understanding and acceptance.  ? ? ? ?Dental advisory given ? ?Plan Discussed with: CRNA and Surgeon ? ?Anesthesia Plan Comments: (PAT note by Antionette Poles, PA-C: ?Patient follows with cardiology at [facility] for  history of HLD, NSVT (two 4 beat runs on monitor 05/2021), PVCs (22% PVC burden by monitor 05/2021, asymptomatic on metoprolol), OSA intolerant to CPAP.  Echo 05/2021 showed normal LV systolic function, no significant valvular abnormalities.  She recently had abnormal nuclear stress 07/28/2021 that was followed up by cardiac catheterization 08/12/2021 which showed normal coronaries.  Stress test was deemed to be false positive.  She was last seen 09/03/2021 and noted to be doing well at that time from a cardiac standpoint, no issues postcatheterization.  No changes made to management, advised to follow-up in 4 months. ? ?CKD stage IIIb. ? ?Preop labs reviewed, creatinine mildly elevated 1.33, labs otherwise unremarkable. ? ?EKG 05/14/2021 (copy on chart): Sinus rhythm with premature supraventricular complexes.  Rate 85.  Low voltage QRS, consider pulmonary disease or obesity. ? ?Cath 08/12/2021 (Care Everywhere): ?CONCLUSIONS: ?  ?1. ?Normal coronary arteries.  ?2. ?Left ventriculography not done to conserve contrast. ?Also she began  ?to have right radial artery spasm?and I did not want to manipulate  ?catheter into ventricle as it would aggravate the spasm.  ?3. RRA access. ?Some radial artery spasm, treated with verapamil. ? ? ?TTE 06/04/2021: ?SUMMARY  ?The left ventricular size is normal.  ?Mild left ventricular hypertrophy  ?Left ventricular systolic function is normal.  ?LV ejection fraction = 60-65%.  ?The right ventricle is normal in size and function.  ?There was insufficient TR detected to calculate RV systolic pressure.  ?The IVC is normal in size with an inspiratory collapse of greater then  ?50%, suggesting normal right atrial pressure.  ?There is no significant  valvular stenosis or regurgitation.  ?There is no pericardial effusion.  ?There is?no comparison study available. ? ? ? ?)  ? ? ? ? ? ?Anesthesia Quick Evaluation ? ?

## 2021-09-27 HISTORY — PX: POSTERIOR LUMBAR FUSION: SHX6036

## 2021-09-28 ENCOUNTER — Ambulatory Visit (HOSPITAL_BASED_OUTPATIENT_CLINIC_OR_DEPARTMENT_OTHER): Payer: Medicare Other | Admitting: Anesthesiology

## 2021-09-28 ENCOUNTER — Ambulatory Visit (HOSPITAL_COMMUNITY): Payer: Medicare Other

## 2021-09-28 ENCOUNTER — Ambulatory Visit (HOSPITAL_COMMUNITY): Payer: Medicare Other | Admitting: Physician Assistant

## 2021-09-28 ENCOUNTER — Other Ambulatory Visit: Payer: Self-pay

## 2021-09-28 ENCOUNTER — Observation Stay (HOSPITAL_COMMUNITY)
Admission: RE | Admit: 2021-09-28 | Discharge: 2021-09-29 | Disposition: A | Discharge status: Home / Self Care | Payer: Medicare Other | Attending: Neurosurgery | Admitting: Neurosurgery

## 2021-09-28 ENCOUNTER — Encounter (HOSPITAL_COMMUNITY)
Admission: RE | Disposition: A | Discharge status: Home / Self Care | Payer: Self-pay | Source: Home / Self Care | Attending: Neurosurgery

## 2021-09-28 ENCOUNTER — Encounter (HOSPITAL_COMMUNITY): Payer: Self-pay | Admitting: Neurosurgery

## 2021-09-28 DIAGNOSIS — Z79899 Other long term (current) drug therapy: Secondary | ICD-10-CM | POA: Insufficient documentation

## 2021-09-28 DIAGNOSIS — M48061 Spinal stenosis, lumbar region without neurogenic claudication: Secondary | ICD-10-CM

## 2021-09-28 DIAGNOSIS — M4316 Spondylolisthesis, lumbar region: Secondary | ICD-10-CM | POA: Insufficient documentation

## 2021-09-28 DIAGNOSIS — I10 Essential (primary) hypertension: Secondary | ICD-10-CM

## 2021-09-28 DIAGNOSIS — M5416 Radiculopathy, lumbar region: Secondary | ICD-10-CM

## 2021-09-28 DIAGNOSIS — N183 Chronic kidney disease, stage 3 unspecified: Secondary | ICD-10-CM | POA: Insufficient documentation

## 2021-09-28 DIAGNOSIS — E039 Hypothyroidism, unspecified: Secondary | ICD-10-CM | POA: Insufficient documentation

## 2021-09-28 DIAGNOSIS — M4310 Spondylolisthesis, site unspecified: Secondary | ICD-10-CM | POA: Diagnosis present

## 2021-09-28 LAB — ABO/RH: ABO/RH(D): O POS

## 2021-09-28 IMAGING — RF DG LUMBAR SPINE 1V
1 series · 1 of 1 positions shown · non-contrast
Comparison: [DATE].

CLINICAL DATA: Post PLIF at L4-5.

EXAM:
LUMBAR SPINE - 1 VIEW; DG C-ARM 1-60 MIN-NO REPORT

[Series 1: run · 1 of 1 slices shown]
[im 1/1]
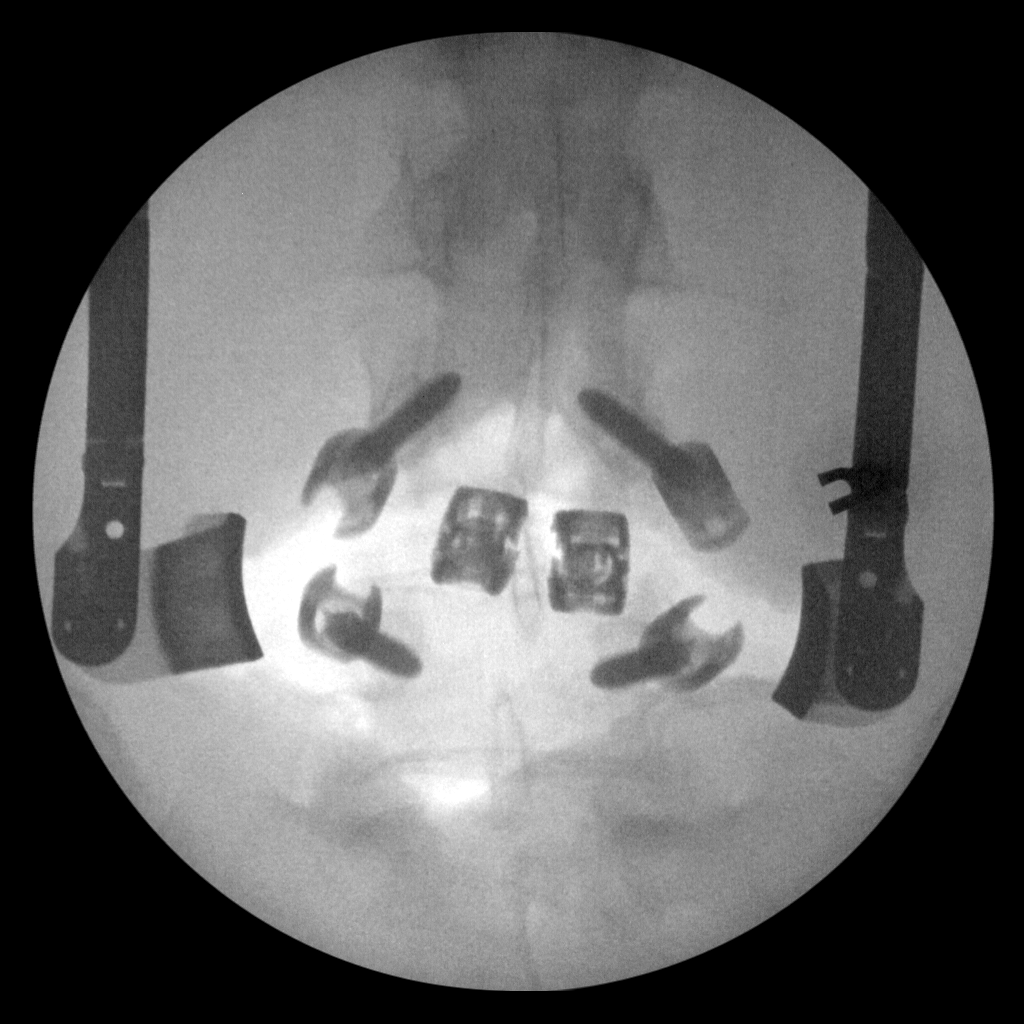

[1 of 1 positions shown; findings below may reference images not displayed]

FINDINGS: A single intraoperative spot view in the AP projection is presented
showing bilateral pedicle screws and interbody cage placement across
a single disc space

Comparison with subsequent imaging shows this corresponds to L4-5.
Localizing on this single projection alone is limited. Gas is
present in the soft tissues with retractors in place lateral to the
spine on either side.
IMPRESSION: Intraoperative spot view of the lumbar spine as described. Post
placement of bilateral pedicle screws and interbody cages.
Assessment of subsequent imaging shows changes present at L4-5
difficult to localize on coned in projection.

Fluoroscopic dose: 51.36 mGy

Fluoroscopic time: 32 seconds.

## 2021-09-28 IMAGING — CR DG LUMBAR SPINE 2-3V
2 series · 2 of 2 positions shown · non-contrast
Comparison: MRI [DATE]

CLINICAL DATA: Intraoperative imaging of L4-5 fixation

EXAM:
LUMBAR SPINE - 2-3 VIEW

[lateral]
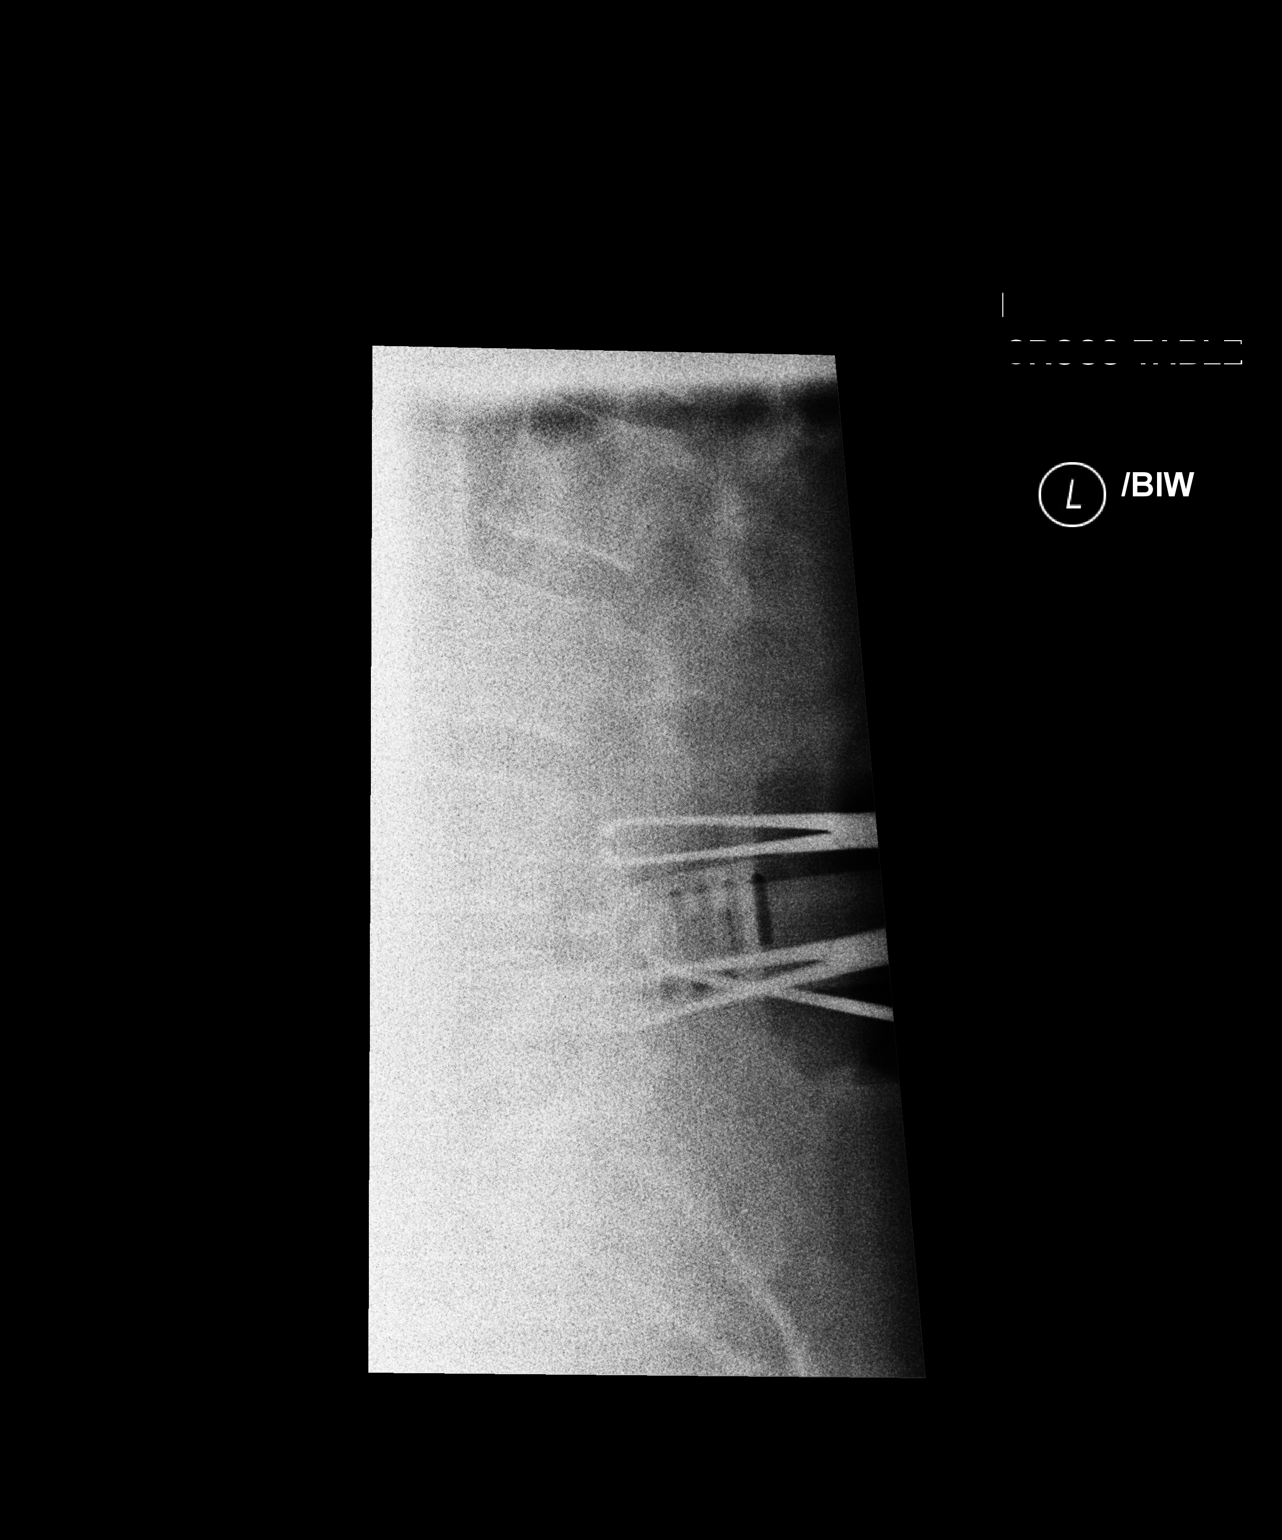

[xtable lateral]
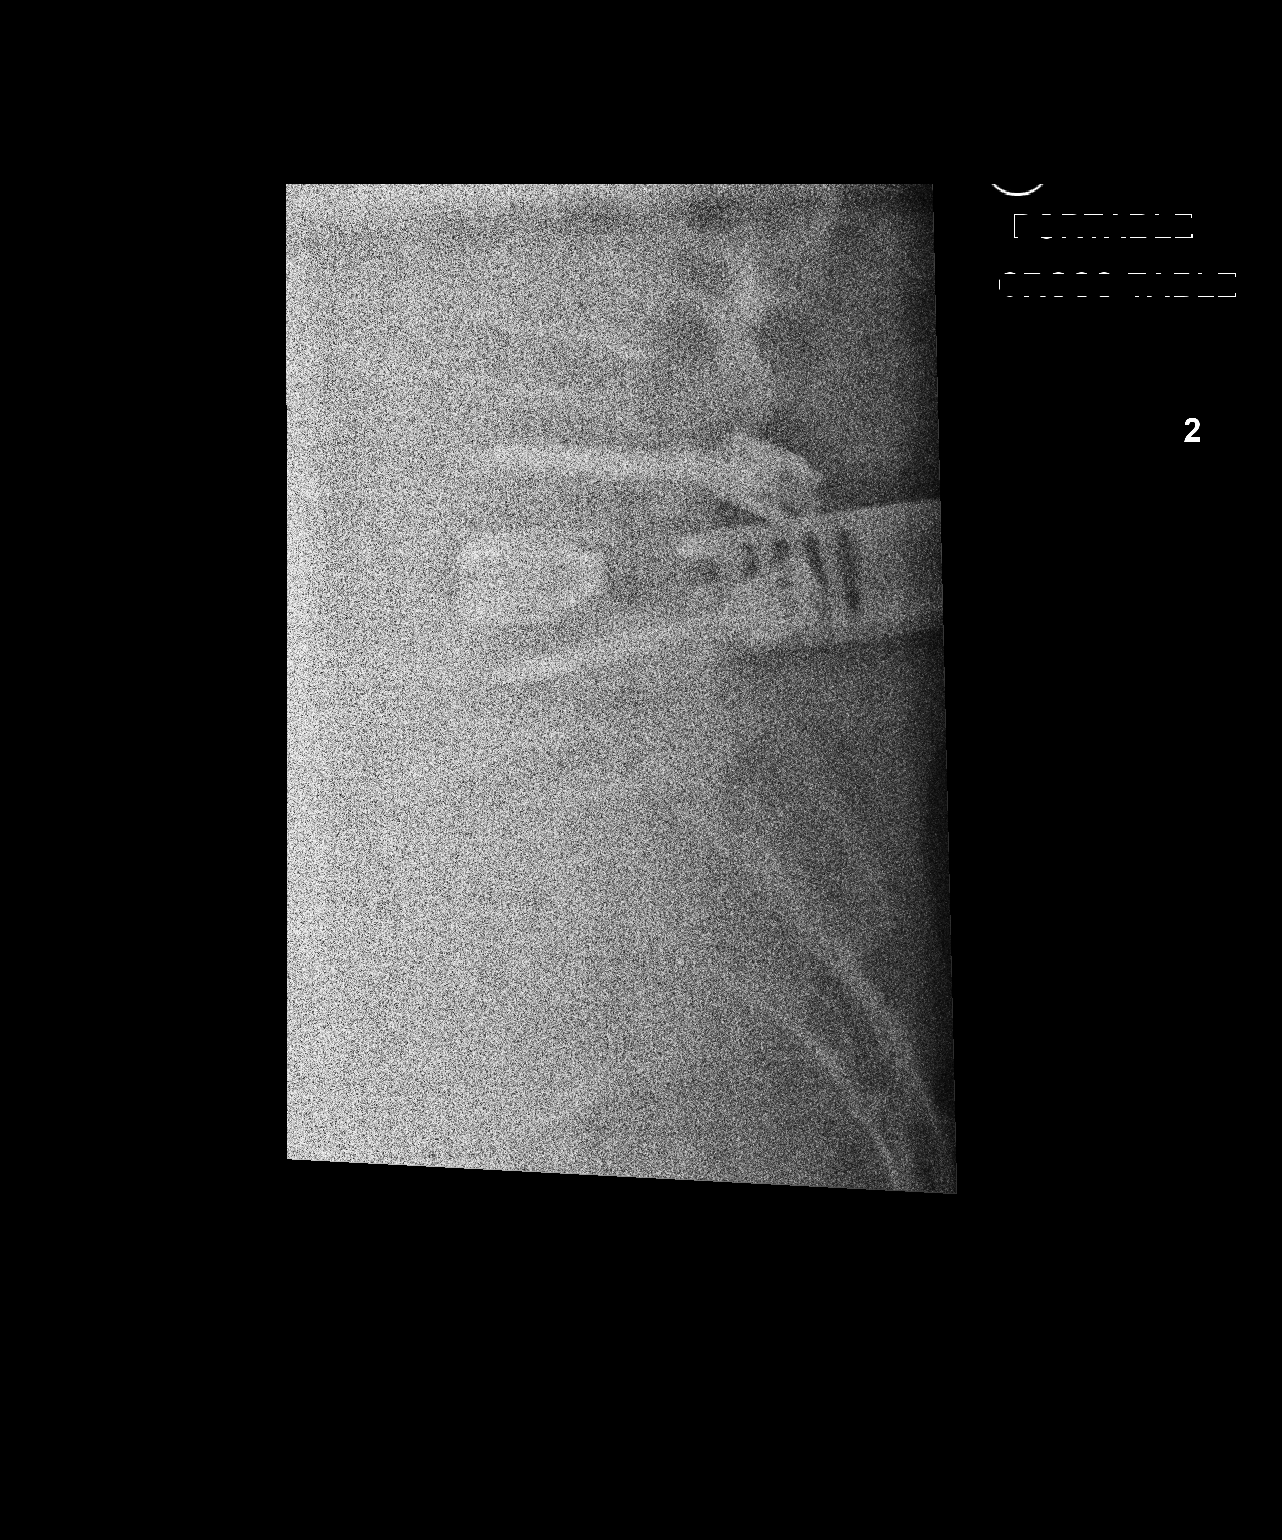

[2 of 2 positions shown; findings below may reference images not displayed]

FINDINGS: Two intraoperative views. The first is labeled [YI] hours. Under
penetrated, with surgical devices projecting posterior to the lumbar
spine, likely L4 and L5.

The second image, labeled [YI] hours, demonstrates interval trans
pedicle screw fixation with interbody fusion material, at L4-5. This
is also under penetrated.
IMPRESSION: Intraoperative imaging of L4-5 fixation.

## 2021-09-28 SURGERY — POSTERIOR LUMBAR FUSION 1 LEVEL
Anesthesia: General | Site: Back

## 2021-09-28 MED ORDER — MIRTAZAPINE 15 MG PO TABS
45.0000 mg | ORAL_TABLET | Freq: Every day | ORAL | Status: DC
Start: 2021-09-28 — End: 2021-09-29
  Administered 2021-09-28: 45 mg via ORAL
  Filled 2021-09-28 (×3): qty 1

## 2021-09-28 MED ORDER — OXYCODONE HCL 5 MG PO TABS
10.0000 mg | ORAL_TABLET | ORAL | Status: DC | PRN
  Administered 2021-09-28 – 2021-09-29 (×4): 10 mg via ORAL
  Filled 2021-09-28 (×4): qty 2

## 2021-09-28 MED ORDER — AMLODIPINE BESYLATE 5 MG PO TABS
5.0000 mg | ORAL_TABLET | Freq: Every day | ORAL | Status: DC
  Administered 2021-09-29: 5 mg via ORAL
  Filled 2021-09-28: qty 1

## 2021-09-28 MED ORDER — HYDROXYZINE HCL 50 MG/ML IM SOLN
50.0000 mg | Freq: Four times a day (QID) | INTRAMUSCULAR | Status: DC | PRN
  Filled 2021-09-28: qty 1

## 2021-09-28 MED ORDER — FERROUS SULFATE 325 (65 FE) MG PO TABS
325.0000 mg | ORAL_TABLET | Freq: Every evening | ORAL | Status: DC
  Administered 2021-09-28: 325 mg via ORAL
  Filled 2021-09-28: qty 1

## 2021-09-28 MED ORDER — OXYCODONE HCL 5 MG PO TABS: 5.0000 mg | ORAL_TABLET | Freq: Once | ORAL | Status: DC | PRN

## 2021-09-28 MED ORDER — BISACODYL 10 MG RE SUPP: 10.0000 mg | Freq: Every day | RECTAL | Status: DC | PRN

## 2021-09-28 MED ORDER — CHLORHEXIDINE GLUCONATE CLOTH 2 % EX PADS: 6.0000 | MEDICATED_PAD | Freq: Once | CUTANEOUS | Status: DC

## 2021-09-28 MED ORDER — PROPOFOL 10 MG/ML IV BOLUS
INTRAVENOUS | Status: AC
  Filled 2021-09-28: qty 20

## 2021-09-28 MED ORDER — MEDROXYPROGESTERONE ACETATE 2.5 MG PO TABS
5.0000 mg | ORAL_TABLET | Freq: Every day | ORAL | Status: DC
  Administered 2021-09-29: 5 mg via ORAL
  Filled 2021-09-28: qty 2

## 2021-09-28 MED ORDER — ONDANSETRON HCL 4 MG PO TABS: 4.0000 mg | ORAL_TABLET | Freq: Four times a day (QID) | ORAL | Status: DC | PRN

## 2021-09-28 MED ORDER — BIOTIN 10000 MCG PO TABS: 10000.0000 ug | ORAL_TABLET | Freq: Every day | ORAL | Status: DC

## 2021-09-28 MED ORDER — SUCCINYLCHOLINE CHLORIDE 200 MG/10ML IV SOSY
PREFILLED_SYRINGE | INTRAVENOUS | Status: DC | PRN
  Administered 2021-09-28: 200 mg via INTRAVENOUS

## 2021-09-28 MED ORDER — PROPOFOL 10 MG/ML IV BOLUS
INTRAVENOUS | Status: DC | PRN
  Administered 2021-09-28: 250 mg via INTRAVENOUS

## 2021-09-28 MED ORDER — MIDAZOLAM HCL 2 MG/2ML IJ SOLN
INTRAMUSCULAR | Status: DC | PRN
Start: 2021-09-28 — End: 2021-09-28
  Administered 2021-09-28: 2 mg via INTRAVENOUS

## 2021-09-28 MED ORDER — SUGAMMADEX SODIUM 500 MG/5ML IV SOLN
INTRAVENOUS | Status: AC
  Filled 2021-09-28: qty 5

## 2021-09-28 MED ORDER — ACETAMINOPHEN 325 MG PO TABS
650.0000 mg | ORAL_TABLET | ORAL | Status: DC | PRN
  Administered 2021-09-29: 650 mg via ORAL
  Filled 2021-09-28: qty 2

## 2021-09-28 MED ORDER — ONDANSETRON HCL 4 MG/2ML IJ SOLN
INTRAMUSCULAR | Status: AC
  Filled 2021-09-28: qty 2

## 2021-09-28 MED ORDER — VANCOMYCIN HCL 1000 MG IV SOLR
INTRAVENOUS | Status: DC | PRN
  Administered 2021-09-28: 1000 mg

## 2021-09-28 MED ORDER — EPHEDRINE SULFATE-NACL 50-0.9 MG/10ML-% IV SOSY
PREFILLED_SYRINGE | INTRAVENOUS | Status: DC | PRN
  Administered 2021-09-28: 5 mg via INTRAVENOUS

## 2021-09-28 MED ORDER — ACETAMINOPHEN 650 MG RE SUPP: 650.0000 mg | RECTAL | Status: DC | PRN

## 2021-09-28 MED ORDER — VITAMIN E 180 MG (400 UNIT) PO CAPS
400.0000 [IU] | ORAL_CAPSULE | Freq: Every day | ORAL | Status: DC
  Administered 2021-09-28 – 2021-09-29 (×2): 400 [IU] via ORAL
  Filled 2021-09-28 (×2): qty 1

## 2021-09-28 MED ORDER — FLEET ENEMA 7-19 GM/118ML RE ENEM: 1.0000 | ENEMA | Freq: Once | RECTAL | Status: DC | PRN

## 2021-09-28 MED ORDER — OXYBUTYNIN CHLORIDE ER 10 MG PO TB24
10.0000 mg | ORAL_TABLET | Freq: Every day | ORAL | Status: DC
  Filled 2021-09-28: qty 1

## 2021-09-28 MED ORDER — DEXAMETHASONE SODIUM PHOSPHATE 10 MG/ML IJ SOLN
INTRAMUSCULAR | Status: AC
  Filled 2021-09-28: qty 1

## 2021-09-28 MED ORDER — 0.9 % SODIUM CHLORIDE (POUR BTL) OPTIME
TOPICAL | Status: DC | PRN
  Administered 2021-09-28: 1000 mL

## 2021-09-28 MED ORDER — EPHEDRINE 5 MG/ML INJ
INTRAVENOUS | Status: AC
  Filled 2021-09-28: qty 5

## 2021-09-28 MED ORDER — LIDOCAINE 2% (20 MG/ML) 5 ML SYRINGE
INTRAMUSCULAR | Status: AC
  Filled 2021-09-28: qty 5

## 2021-09-28 MED ORDER — LEVOTHYROXINE SODIUM 25 MCG PO TABS
50.0000 ug | ORAL_TABLET | Freq: Every day | ORAL | Status: DC
  Administered 2021-09-29: 50 ug via ORAL
  Filled 2021-09-28: qty 2

## 2021-09-28 MED ORDER — SODIUM CHLORIDE 0.9% FLUSH: 3.0000 mL | INTRAVENOUS | Status: DC | PRN

## 2021-09-28 MED ORDER — BUSPIRONE HCL 15 MG PO TABS
30.0000 mg | ORAL_TABLET | Freq: Two times a day (BID) | ORAL | Status: DC
  Administered 2021-09-28 – 2021-09-29 (×2): 30 mg via ORAL
  Filled 2021-09-28 (×4): qty 2

## 2021-09-28 MED ORDER — ROSUVASTATIN CALCIUM 5 MG PO TABS
10.0000 mg | ORAL_TABLET | Freq: Every day | ORAL | Status: DC
  Administered 2021-09-28 – 2021-09-29 (×2): 10 mg via ORAL
  Filled 2021-09-28 (×2): qty 2

## 2021-09-28 MED ORDER — HYDROMORPHONE HCL 1 MG/ML IJ SOLN
INTRAMUSCULAR | Status: AC
  Filled 2021-09-28: qty 1

## 2021-09-28 MED ORDER — HYDROCODONE-ACETAMINOPHEN 10-325 MG PO TABS
1.0000 | ORAL_TABLET | ORAL | Status: DC | PRN
  Administered 2021-09-28 – 2021-09-29 (×2): 1 via ORAL
  Filled 2021-09-28 (×2): qty 1

## 2021-09-28 MED ORDER — ROCURONIUM BROMIDE 10 MG/ML (PF) SYRINGE
PREFILLED_SYRINGE | INTRAVENOUS | Status: DC | PRN
  Administered 2021-09-28: 30 mg via INTRAVENOUS
  Administered 2021-09-28 (×2): 50 mg via INTRAVENOUS
  Administered 2021-09-28: 30 mg via INTRAVENOUS

## 2021-09-28 MED ORDER — SODIUM CHLORIDE 0.9 % IV SOLN: 250.0000 mL | INTRAVENOUS | Status: DC

## 2021-09-28 MED ORDER — FENTANYL CITRATE (PF) 250 MCG/5ML IJ SOLN
INTRAMUSCULAR | Status: DC | PRN
  Administered 2021-09-28 (×5): 50 ug via INTRAVENOUS

## 2021-09-28 MED ORDER — ONDANSETRON HCL 4 MG/2ML IJ SOLN: 4.0000 mg | Freq: Four times a day (QID) | INTRAMUSCULAR | Status: DC | PRN

## 2021-09-28 MED ORDER — DIAZEPAM 5 MG PO TABS
5.0000 mg | ORAL_TABLET | Freq: Four times a day (QID) | ORAL | Status: DC | PRN
  Administered 2021-09-28 – 2021-09-29 (×2): 5 mg via ORAL
  Filled 2021-09-28 (×2): qty 1

## 2021-09-28 MED ORDER — BUPIVACAINE HCL (PF) 0.25 % IJ SOLN
INTRAMUSCULAR | Status: AC
  Filled 2021-09-28: qty 30

## 2021-09-28 MED ORDER — PRAMIPEXOLE DIHYDROCHLORIDE 0.25 MG PO TABS
0.2500 mg | ORAL_TABLET | Freq: Two times a day (BID) | ORAL | Status: DC
  Administered 2021-09-28 – 2021-09-29 (×2): 0.25 mg via ORAL
  Filled 2021-09-28 (×2): qty 1

## 2021-09-28 MED ORDER — SUGAMMADEX SODIUM 200 MG/2ML IV SOLN
INTRAVENOUS | Status: DC | PRN
Start: 2021-09-28 — End: 2021-09-28
  Administered 2021-09-28: 500 mg via INTRAVENOUS

## 2021-09-28 MED ORDER — PHENYLEPHRINE HCL-NACL 20-0.9 MG/250ML-% IV SOLN
INTRAVENOUS | Status: DC | PRN
Start: 2021-09-28 — End: 2021-09-28
  Administered 2021-09-28: 40 ug/min via INTRAVENOUS

## 2021-09-28 MED ORDER — ORAL CARE MOUTH RINSE: 15.0000 mL | Freq: Once | OROMUCOSAL | Status: AC

## 2021-09-28 MED ORDER — ROCURONIUM BROMIDE 10 MG/ML (PF) SYRINGE
PREFILLED_SYRINGE | INTRAVENOUS | Status: AC
  Filled 2021-09-28: qty 10

## 2021-09-28 MED ORDER — LACTATED RINGERS IV SOLN: INTRAVENOUS | Status: DC

## 2021-09-28 MED ORDER — DEXAMETHASONE SODIUM PHOSPHATE 10 MG/ML IJ SOLN
INTRAMUSCULAR | Status: DC | PRN
  Administered 2021-09-28: 10 mg via INTRAVENOUS

## 2021-09-28 MED ORDER — PHENYLEPHRINE 40 MCG/ML (10ML) SYRINGE FOR IV PUSH (FOR BLOOD PRESSURE SUPPORT)
PREFILLED_SYRINGE | INTRAVENOUS | Status: DC | PRN
  Administered 2021-09-28: 40 ug via INTRAVENOUS
  Administered 2021-09-28: 80 ug via INTRAVENOUS
  Administered 2021-09-28: 40 ug via INTRAVENOUS

## 2021-09-28 MED ORDER — PANTOPRAZOLE SODIUM 40 MG PO TBEC
40.0000 mg | DELAYED_RELEASE_TABLET | Freq: Every day | ORAL | Status: DC
  Administered 2021-09-29: 40 mg via ORAL
  Filled 2021-09-28: qty 1

## 2021-09-28 MED ORDER — METOPROLOL SUCCINATE ER 25 MG PO TB24
25.0000 mg | ORAL_TABLET | Freq: Every day | ORAL | Status: DC
  Administered 2021-09-29: 25 mg via ORAL
  Filled 2021-09-28: qty 1

## 2021-09-28 MED ORDER — FENTANYL CITRATE (PF) 250 MCG/5ML IJ SOLN
INTRAMUSCULAR | Status: AC
Start: 2021-09-28 — End: ?
  Filled 2021-09-28: qty 5

## 2021-09-28 MED ORDER — DEXMEDETOMIDINE (PRECEDEX) IN NS 20 MCG/5ML (4 MCG/ML) IV SYRINGE
PREFILLED_SYRINGE | INTRAVENOUS | Status: DC | PRN
  Administered 2021-09-28 (×2): 4 ug via INTRAVENOUS
  Administered 2021-09-28: 8 ug via INTRAVENOUS
  Administered 2021-09-28: 4 ug via INTRAVENOUS

## 2021-09-28 MED ORDER — SUCCINYLCHOLINE CHLORIDE 200 MG/10ML IV SOSY
PREFILLED_SYRINGE | INTRAVENOUS | Status: AC
  Filled 2021-09-28: qty 10

## 2021-09-28 MED ORDER — LIDOCAINE 2% (20 MG/ML) 5 ML SYRINGE
INTRAMUSCULAR | Status: DC | PRN
  Administered 2021-09-28: 100 mg via INTRAVENOUS

## 2021-09-28 MED ORDER — CEFAZOLIN IN SODIUM CHLORIDE 3-0.9 GM/100ML-% IV SOLN
3.0000 g | INTRAVENOUS | Status: AC
  Administered 2021-09-28: 3 g via INTRAVENOUS
  Filled 2021-09-28: qty 100

## 2021-09-28 MED ORDER — HYDROMORPHONE HCL 1 MG/ML IJ SOLN: 1.0000 mg | INTRAMUSCULAR | Status: DC | PRN

## 2021-09-28 MED ORDER — LACTATED RINGERS IV SOLN: INTRAVENOUS | Status: DC | PRN

## 2021-09-28 MED ORDER — THROMBIN 20000 UNITS EX SOLR
CUTANEOUS | Status: AC
  Filled 2021-09-28: qty 20000

## 2021-09-28 MED ORDER — HYDROXYZINE HCL 25 MG PO TABS
50.0000 mg | ORAL_TABLET | Freq: Every evening | ORAL | Status: DC
  Administered 2021-09-28: 50 mg via ORAL
  Filled 2021-09-28: qty 2

## 2021-09-28 MED ORDER — SODIUM CHLORIDE 0.9% FLUSH: 3.0000 mL | Freq: Two times a day (BID) | INTRAVENOUS | Status: DC

## 2021-09-28 MED ORDER — ACETAMINOPHEN 10 MG/ML IV SOLN
1000.0000 mg | Freq: Once | INTRAVENOUS | Status: DC | PRN
  Administered 2021-09-28: 1000 mg via INTRAVENOUS

## 2021-09-28 MED ORDER — CHLORHEXIDINE GLUCONATE 0.12 % MT SOLN
15.0000 mL | Freq: Once | OROMUCOSAL | Status: AC
  Administered 2021-09-28: 15 mL via OROMUCOSAL
  Filled 2021-09-28: qty 15

## 2021-09-28 MED ORDER — OXYCODONE HCL 5 MG/5ML PO SOLN: 5.0000 mg | Freq: Once | ORAL | Status: DC | PRN

## 2021-09-28 MED ORDER — PHENYLEPHRINE 40 MCG/ML (10ML) SYRINGE FOR IV PUSH (FOR BLOOD PRESSURE SUPPORT)
PREFILLED_SYRINGE | INTRAVENOUS | Status: AC
  Filled 2021-09-28: qty 10

## 2021-09-28 MED ORDER — MENTHOL 3 MG MT LOZG: 1.0000 | LOZENGE | OROMUCOSAL | Status: DC | PRN

## 2021-09-28 MED ORDER — ACETAMINOPHEN 10 MG/ML IV SOLN
INTRAVENOUS | Status: AC
  Filled 2021-09-28: qty 100

## 2021-09-28 MED ORDER — POLYETHYLENE GLYCOL 3350 17 G PO PACK: 17.0000 g | PACK | Freq: Every day | ORAL | Status: DC | PRN

## 2021-09-28 MED ORDER — HYDROMORPHONE HCL 1 MG/ML IJ SOLN
0.2500 mg | INTRAMUSCULAR | Status: DC | PRN
  Administered 2021-09-28: 0.25 mg via INTRAVENOUS
  Administered 2021-09-28: 0.5 mg via INTRAVENOUS
  Administered 2021-09-28 (×2): 0.25 mg via INTRAVENOUS
  Administered 2021-09-28: 0.5 mg via INTRAVENOUS

## 2021-09-28 MED ORDER — MIDAZOLAM HCL 2 MG/2ML IJ SOLN
INTRAMUSCULAR | Status: AC
  Filled 2021-09-28: qty 2

## 2021-09-28 MED ORDER — CEFAZOLIN SODIUM-DEXTROSE 1-4 GM/50ML-% IV SOLN
1.0000 g | Freq: Three times a day (TID) | INTRAVENOUS | Status: AC
  Administered 2021-09-28 – 2021-09-29 (×2): 1 g via INTRAVENOUS
  Filled 2021-09-28 (×2): qty 50

## 2021-09-28 MED ORDER — THROMBIN 20000 UNITS EX SOLR
CUTANEOUS | Status: DC | PRN
  Administered 2021-09-28: 20 mL via TOPICAL

## 2021-09-28 MED ORDER — BUPIVACAINE HCL (PF) 0.25 % IJ SOLN
INTRAMUSCULAR | Status: DC | PRN
  Administered 2021-09-28: 30 mL

## 2021-09-28 MED ORDER — VANCOMYCIN HCL 1000 MG IV SOLR
INTRAVENOUS | Status: AC
  Filled 2021-09-28: qty 20

## 2021-09-28 MED ORDER — ONDANSETRON HCL 4 MG/2ML IJ SOLN
INTRAMUSCULAR | Status: DC | PRN
  Administered 2021-09-28: 4 mg via INTRAVENOUS

## 2021-09-28 MED ORDER — PHENOL 1.4 % MT LIQD: 1.0000 | OROMUCOSAL | Status: DC | PRN

## 2021-09-28 MED ORDER — DEXMEDETOMIDINE (PRECEDEX) IN NS 20 MCG/5ML (4 MCG/ML) IV SYRINGE
PREFILLED_SYRINGE | INTRAVENOUS | Status: AC
  Filled 2021-09-28: qty 5

## 2021-09-28 MED ORDER — LITHIUM CARBONATE 300 MG PO CAPS
450.0000 mg | ORAL_CAPSULE | Freq: Every day | ORAL | Status: DC
  Administered 2021-09-28 – 2021-09-29 (×2): 450 mg via ORAL
  Filled 2021-09-28 (×3): qty 1

## 2021-09-28 MED ORDER — VITAMIN D 25 MCG (1000 UNIT) PO TABS
2000.0000 [IU] | ORAL_TABLET | Freq: Every day | ORAL | Status: DC
  Administered 2021-09-28 – 2021-09-29 (×2): 2000 [IU] via ORAL
  Filled 2021-09-28 (×2): qty 2

## 2021-09-28 MED ORDER — ONDANSETRON HCL 4 MG/2ML IJ SOLN
4.0000 mg | Freq: Once | INTRAMUSCULAR | Status: AC | PRN
  Administered 2021-09-28: 4 mg via INTRAVENOUS

## 2021-09-28 SURGICAL SUPPLY — 66 items
BAG COUNTER SPONGE SURGICOUNT (BAG) ×2 IMPLANT
BAG DECANTER FOR FLEXI CONT (MISCELLANEOUS) ×2 IMPLANT
BENZOIN TINCTURE PRP APPL 2/3 (GAUZE/BANDAGES/DRESSINGS) ×2 IMPLANT
BLADE CLIPPER SURG (BLADE) IMPLANT
BONE GRAFTON DBF INJECT 6CC (Bone Implant) ×1 IMPLANT
BUR CUTTER 7.0 ROUND (BURR) IMPLANT
BUR MATCHSTICK NEURO 3.0 LAGG (BURR) ×2 IMPLANT
CANISTER SUCT 3000ML PPV (MISCELLANEOUS) ×2 IMPLANT
CAP LCK SPNE (Orthopedic Implant) ×4 IMPLANT
CAP LOCK SPINE RADIUS (Orthopedic Implant) IMPLANT
CAP LOCKING (Orthopedic Implant) ×8 IMPLANT
CARTRIDGE OIL MAESTRO DRILL (MISCELLANEOUS) ×1 IMPLANT
CLSR STERI-STRIP ANTIMIC 1/2X4 (GAUZE/BANDAGES/DRESSINGS) ×1 IMPLANT
CNTNR URN SCR LID CUP LEK RST (MISCELLANEOUS) ×1 IMPLANT
CONT SPEC 4OZ STRL OR WHT (MISCELLANEOUS) ×2
COVER BACK TABLE 60X90IN (DRAPES) ×2 IMPLANT
DECANTER SPIKE VIAL GLASS SM (MISCELLANEOUS) ×1 IMPLANT
DERMABOND ADVANCED (GAUZE/BANDAGES/DRESSINGS) ×1
DERMABOND ADVANCED .7 DNX12 (GAUZE/BANDAGES/DRESSINGS) ×1 IMPLANT
DIFFUSER DRILL AIR PNEUMATIC (MISCELLANEOUS) ×2 IMPLANT
DRAPE C-ARM 42X72 X-RAY (DRAPES) ×4 IMPLANT
DRAPE HALF SHEET 40X57 (DRAPES) ×1 IMPLANT
DRAPE LAPAROTOMY 100X72X124 (DRAPES) ×2 IMPLANT
DRAPE SURG 17X23 STRL (DRAPES) ×8 IMPLANT
DRSG OPSITE POSTOP 4X6 (GAUZE/BANDAGES/DRESSINGS) ×2 IMPLANT
DRSG OPSITE POSTOP 4X8 (GAUZE/BANDAGES/DRESSINGS) ×1 IMPLANT
DURAPREP 26ML APPLICATOR (WOUND CARE) ×2 IMPLANT
ELECT REM PT RETURN 9FT ADLT (ELECTROSURGICAL) ×2
ELECTRODE REM PT RTRN 9FT ADLT (ELECTROSURGICAL) ×1 IMPLANT
EVACUATOR 1/8 PVC DRAIN (DRAIN) ×1 IMPLANT
GAUZE 4X4 16PLY ~~LOC~~+RFID DBL (SPONGE) ×1 IMPLANT
GAUZE SPONGE 4X4 12PLY STRL (GAUZE/BANDAGES/DRESSINGS) IMPLANT
GLOVE EXAM NITRILE XL STR (GLOVE) IMPLANT
GLOVE SURG ENC MOIS LTX SZ6.5 (GLOVE) ×2 IMPLANT
GLOVE SURG LTX SZ9 (GLOVE) ×4 IMPLANT
GLOVE SURG UNDER POLY LF SZ6.5 (GLOVE) ×4 IMPLANT
GLOVE SURG UNDER POLY LF SZ7 (GLOVE) ×1 IMPLANT
GOWN STRL REUS W/ TWL LRG LVL3 (GOWN DISPOSABLE) IMPLANT
GOWN STRL REUS W/ TWL XL LVL3 (GOWN DISPOSABLE) ×2 IMPLANT
GOWN STRL REUS W/TWL 2XL LVL3 (GOWN DISPOSABLE) IMPLANT
GOWN STRL REUS W/TWL LRG LVL3 (GOWN DISPOSABLE)
GOWN STRL REUS W/TWL XL LVL3 (GOWN DISPOSABLE) ×4
KIT BASIN OR (CUSTOM PROCEDURE TRAY) ×2 IMPLANT
KIT TURNOVER KIT B (KITS) ×2 IMPLANT
MILL MEDIUM DISP (BLADE) ×2 IMPLANT
NEEDLE HYPO 22GX1.5 SAFETY (NEEDLE) ×2 IMPLANT
NS IRRIG 1000ML POUR BTL (IV SOLUTION) ×2 IMPLANT
OIL CARTRIDGE MAESTRO DRILL (MISCELLANEOUS) ×2
PACK LAMINECTOMY NEURO (CUSTOM PROCEDURE TRAY) ×2 IMPLANT
ROD RADIUS 40MM (Neuro Prosthesis/Implant) ×4 IMPLANT
ROD SPNL 40X5.5XNS TI RDS (Neuro Prosthesis/Implant) IMPLANT
SCREW 5.75X45MM (Screw) ×4 IMPLANT
SPACER PL CATALYFT LONG 11 (Spacer) ×2 IMPLANT
SPONGE SURGIFOAM ABS GEL 100 (HEMOSTASIS) ×2 IMPLANT
SPONGE T-LAP 4X18 ~~LOC~~+RFID (SPONGE) ×3 IMPLANT
STRIP CLOSURE SKIN 1/2X4 (GAUZE/BANDAGES/DRESSINGS) ×4 IMPLANT
SUT ETHILON 2 0 FS 18 (SUTURE) ×3 IMPLANT
SUT ETHILON 2 0 PSLX (SUTURE) ×3 IMPLANT
SUT VIC AB 0 CT1 18XCR BRD8 (SUTURE) ×2 IMPLANT
SUT VIC AB 0 CT1 8-18 (SUTURE) ×6
SUT VIC AB 2-0 CT1 18 (SUTURE) ×2 IMPLANT
SUT VIC AB 3-0 SH 8-18 (SUTURE) ×4 IMPLANT
TOWEL GREEN STERILE (TOWEL DISPOSABLE) ×2 IMPLANT
TOWEL GREEN STERILE FF (TOWEL DISPOSABLE) ×2 IMPLANT
TRAY FOLEY MTR SLVR 16FR STAT (SET/KITS/TRAYS/PACK) ×2 IMPLANT
WATER STERILE IRR 1000ML POUR (IV SOLUTION) ×2 IMPLANT

## 2021-09-28 NOTE — Transfer of Care (Signed)
Immediate Anesthesia Transfer of Care Note ? ?Patient: Natasha Cole ? ?Procedure(s) Performed: Posterior Lumbar Interbody Fusion - Lumbar Four- Lumbar Five (Back) ? ?Patient Location: PACU ? ?Anesthesia Type:General ? ?Level of Consciousness: awake and drowsy ? ?Airway & Oxygen Therapy: Patient Spontanous Breathing and Patient connected to face mask oxygen ? ?Post-op Assessment: Report given to RN and Post -op Vital signs reviewed and stable ? ?Post vital signs: Reviewed and stable ? ?Last Vitals:  ?Vitals Value Taken Time  ?BP 106/54 09/28/21 1500  ?Temp 36.8 ?C 09/28/21 1500  ?Pulse 84 09/28/21 1501  ?Resp 11 09/28/21 1504  ?SpO2 100 % 09/28/21 1501  ?Vitals shown include unvalidated device data. ? ?Last Pain:  ?Vitals:  ? 09/28/21 1028  ?TempSrc:   ?PainSc: 4   ?   ? ?Patients Stated Pain Goal: 2 (09/28/21 1028) ? ?Complications: No notable events documented. ?

## 2021-09-28 NOTE — Anesthesia Postprocedure Evaluation (Signed)
Anesthesia Post Note ? ?Patient: Natasha Cole ? ?Procedure(s) Performed: Posterior Lumbar Interbody Fusion - Lumbar Four- Lumbar Five (Back) ? ?  ? ?Patient location during evaluation: PACU ?Anesthesia Type: General ?Level of consciousness: awake and alert ?Pain management: pain level controlled ?Vital Signs Assessment: post-procedure vital signs reviewed and stable ?Respiratory status: spontaneous breathing, nonlabored ventilation, respiratory function stable and patient connected to nasal cannula oxygen ?Cardiovascular status: blood pressure returned to baseline and stable ?Postop Assessment: no apparent nausea or vomiting ?Anesthetic complications: no ? ? ?No notable events documented. ? ?Last Vitals:  ?Vitals:  ? 09/28/21 1530 09/28/21 1545  ?BP: (!) 127/33 (!) 130/52  ?Pulse: (!) 38 77  ?Resp: 20 15  ?Temp:    ?SpO2: 100% 99%  ?  ?Last Pain:  ?Vitals:  ? 09/28/21 1545  ?TempSrc:   ?PainSc: 6   ? ? ?  ?  ?  ?  ?  ?  ? ?Joylyn Duggin S ? ? ? ? ?

## 2021-09-28 NOTE — Anesthesia Procedure Notes (Addendum)
Procedure Name: Intubation ?Date/Time: 09/28/2021 11:38 AM ?Performed by: Zollie Beckers, CRNA ?Pre-anesthesia Checklist: Patient identified, Emergency Drugs available, Suction available and Patient being monitored ?Patient Re-evaluated:Patient Re-evaluated prior to induction ?Oxygen Delivery Method: Circle system utilized ?Preoxygenation: Pre-oxygenation with 100% oxygen ?Induction Type: IV induction ?Ventilation: Mask ventilation without difficulty ?Laryngoscope Size: Glidescope and 3 ?Grade View: Grade I ?Tube type: Oral ?Number of attempts: 1 ?Airway Equipment and Method: Stylet and Rigid stylet ?Placement Confirmation: ETT inserted through vocal cords under direct vision, positive ETCO2 and breath sounds checked- equal and bilateral ?Secured at: 22 cm ?Tube secured with: Tape ?Dental Injury: Teeth and Oropharynx as per pre-operative assessment  ?Comments: Inserted by Guss Bunde, RN.  ? ? ? ? ?

## 2021-09-28 NOTE — Op Note (Signed)
Date of procedure: 09/28/2021 ? ?Date of dictation: Same ? ?Service: Neurosurgery ? ?Preoperative diagnosis: Grade 1 L4-5 degenerative spondylolisthesis with stenosis and radiculopathy ? ?Postoperative diagnosis: Same ? ?Procedure Name: Bilateral L4-5 decompressive laminotomies and foraminotomies, more than would be required for simple interbody fusion alone. ? ?L4-5 posterior lumbar interbody fusion utilizing interbody cages, locally harvested autograft, and morselized allograft ? ?L4-5 posterior lateral arthrodesis utilizing nonsegmental pedicle screw fixation and local autografting. ? ?Surgeon:Aldrin Engelhard A.Glenn Gullickson, M.D. ? ?Asst. Surgeon: Doran Durand, NP ? ?Anesthesia: General ? ?Indication: 51 year old female with severe back and bilateral lower extremity pain failing conservative management.  Work-up demonstrates evidence of a grade 1 L4-5 degenerative spondylolisthesis with facet arthropathy and lateral recess and foraminal stenosis.  Patient has failed conservative management.  She is morbidly obese.  She recognizes that any surgical undertaking is higher risk given her size.  She states that she cannot live with her current pain level and accepts the risks of surgery.  Patient presents now for L4-5 decompression and fusion surgery. ? ?Operative note: After induction of anesthesia, patient position prone onto Wilson frame and properly padded.  Lumbar region prepped and draped sterilely.  Incision made overlying L4-5.  Dissection performed in the midline.  Subcutaneous fat was extensively resected to aid in exposure.  Dissection was then performed bilaterally exposing L4 and L5.  Retractor was placed.  X-ray was taken as fluoroscopy was ineffective.  Level was confirmed.  Decompressive laminotomies and facetectomies were then performed bilaterally using Leksell rongeurs, Kerrison rongeurs and high-speed drill to remove the inferior two thirds lamina of L4 the entire inferior facet and pars interarticularis of L4 bilaterally,  and the majority the superior facet of L5, and the superior aspect of the L5 lamina.  Ligament flavum elevated and resected.  Foraminotomies completed on the course exiting L4 and L5 nerve roots bilaterally.  Bilateral discectomies then performed at L4-5.  The space then prepared for interbody fusion.  With a distractor placed patient's right side an 11 mm Medtronic expandable cage was then impacted into place and expanded.  Distractor removed patient's right side.  The space then prepared on the right side.  Morselized autograft packed in interspace.  Second cage was then impacted in place and expanded.  Pedicles of L4 and L5 were then identified using surface landmarks.  Pilot holes were then drilled.  Pilot holes were tapped with a screw all.  Markers were placed.  X-rays were taken and good trajectory was confirmed.  Each all track was then tapped with a screw tap.  Each screw temple was probed and found to be solid within the bone.  5.75 mm radius brand screws from [employer] placed bilaterally at L4 and L5.  Final images reveal good position of the hardware and the cages at the proper operative level with normal alignment of spine.  Wound is then irrigated.  Morselized demineralized bone matrix was then packed into the cages bilaterally.  Short segment of titanium rod placed over the screw heads at L4 and L5.  Locking caps placed over the screws and.  Locking caps and engaged with construct under mild compression.  Transverse processes of L4 and L5 decorticated.  Morselized autograft was packed posterior laterally.  Gelfoam placed over the laminotomy sites.  Vancomycin powder placed the deep wound space.  Wound was then closed in layers with Vicryl sutures.  A medium Hemovac drain was left in the deep compartment and a second drain was left in the superficial compartment.  The wound was  further supported by interrupted 2-0 nylon sutures in a vertical mattress fashion.  There were no apparent  complications.  Patient tolerated the procedure well and she returns to the recovery room postop. ? ?

## 2021-09-28 NOTE — H&P (Signed)
Natasha Cole is an 51 y.o. female.   ?Chief Complaint: Back pain ?HPI: 51 year old female with severe back and bilateral lower extremity pain failing conservative management work-up demonstrates evidence of degenerative spondylolisthesis with associated facet arthropathy and lateral recess and foraminal stenosis at 4 5.  Patient has failed conservative management.  She presents now for L4-5 decompression and fusion in hopes of improving her symptoms. ? ?Past Medical History:  ?Diagnosis Date  ? Anemia   ? iron-deficiency anemia  ? Arthritis   ? back and hips  ? Chronic kidney disease   ? stage 3  ? DDD (degenerative disc disease), lumbar   ? Depression   ? GERD (gastroesophageal reflux disease)   ? History of kidney stones   ? pt states she currently has a kidney stone in right kidney 09-24-21  ? Hypothyroidism   ? Nonsustained ventricular tachycardia (Graf) 06/25/2021  ? pt had two 4-beat runs  ? Panic attacks   ? Pre-diabetes   ? PVC (premature ventricular contraction)   ? Sleep apnea   ? does not wear CPAP  ? ? ?Past Surgical History:  ?Procedure Laterality Date  ? ANKLE SURGERY Right   ? CHOLECYSTECTOMY    ? gastic bypass  2012  ? reversal gastric bypass  2018  ? TUBAL LIGATION    ? ? ?Family History  ?Problem Relation Age of Onset  ? Allergic rhinitis Father   ? Hypertension Father   ? Stroke Father   ? Hypertension Brother   ? Hypercholesterolemia Brother   ? ?Social History:  reports that she has never smoked. She has never used smokeless tobacco. She reports that she does not currently use alcohol. She reports that she does not use drugs. ? ?Allergies:  ?Allergies  ?Allergen Reactions  ? Imipramine Itching  ? Nsaids   ?  Has to be careful 'because of my kidneys.'  ? ? ?Facility-Administered Medications Prior to Admission  ?Medication Dose Route Frequency Provider Last Rate Last Admin  ? omalizumab Arvid Right) injection 300 mg  300 mg Subcutaneous Q28 days Jiles Prows, MD   300 mg at 04/17/20 0930   ? ?Medications Prior to Admission  ?Medication Sig Dispense Refill  ? amLODipine (NORVASC) 5 MG tablet Take 5 mg by mouth daily.    ? Biotin 10000 MCG TABS Take 10,000 mcg by mouth daily.    ? busPIRone (BUSPAR) 30 MG tablet TAKE 1 TABLET BY MOUTH TWICE DAILY, GENERIC EQUIVALENT FOR BUSPAR 180 tablet 1  ? Cholecalciferol (VITAMIN D) 50 MCG (2000 UT) tablet Take 2,000 Units by mouth daily.    ? EPINEPHrine 0.3 mg/0.3 mL IJ SOAJ injection Use as directed for life threatening allergic reactions only 2 each 3  ? ferrous sulfate 325 (65 FE) MG tablet Take 325 mg by mouth every evening.    ? hydrOXYzine (ATARAX) 25 MG tablet Take 1 tablet (25 mg total) by mouth 2 (two) times daily. (Patient taking differently: Take 50 mg by mouth every evening.) 180 tablet 1  ? levothyroxine (SYNTHROID, LEVOTHROID) 50 MCG tablet Take 50 mcg by mouth daily before breakfast.    ? lithium carbonate 150 MG capsule Take 3 capsules (450 mg total) by mouth daily. 270 capsule 1  ? medroxyPROGESTERone (PROVERA) 2.5 MG tablet Take 5 mg by mouth daily.    ? metoprolol succinate (TOPROL-XL) 25 MG 24 hr tablet Take 25 mg by mouth daily.    ? mirtazapine (REMERON) 45 MG tablet Take 1 tablet (45 mg total) by  mouth at bedtime. 90 tablet 1  ? omeprazole (PRILOSEC) 20 MG capsule Take 20 mg by mouth daily.    ? pramipexole (MIRAPEX) 0.125 MG tablet Take 2 tablets (0.25 mg total) by mouth 2 (two) times daily. 360 tablet 1  ? rosuvastatin (CRESTOR) 10 MG tablet TAKE 1 TABLET BY MOUTH ONCE DAILY    ? tolterodine (DETROL) 2 MG tablet Take 2 mg by mouth 2 (two) times daily.    ? Vitamin E 180 MG (400 UNIT) CAPS Take 400 Units by mouth daily.    ? ? ?Results for orders placed or performed during the hospital encounter of 09/28/21 (from the past 48 hour(s))  ?ABO/Rh     Status: None  ? Collection Time: 09/28/21 10:40 AM  ?Result Value Ref Range  ? ABO/RH(D)    ?  O POS ?Performed at [facility], [place] [address] [place], [place] [address] ?  ? ?No results  found. ? ?Pertinent items noted in HPI and remainder of comprehensive ROS otherwise negative. ? ?Blood pressure (!) 189/79, pulse 70, temperature 98.1 ?F (36.7 ?C), temperature source Oral, resp. rate 20, height 5\' 3"  (1.6 m), weight (!) 158.8 kg, SpO2 98 %. ? ?Awake and alert.  Oriented and appropriate.  Motor and sensory function intact.  Straight raising equivocal bilaterally.  Reflexes hypoactive but symmetric.  Gait antalgic.  Posture mildly flexed.  Examination head ears eyes nose and throat is unremarked.  Chest and abdomen are obese but otherwise benign.  Extremities are free from injury or deformity. ?Assessment/Plan ?L4-5 degenerative spondylolisthesis with stenosis.  Plan bilateral L4-5 decompressive laminotomies and foraminotomies followed by posterior lumbar MRI fusion utilizing interbody cages, local foot autograft, and augmented with posterior arthrodesis utilizing nonsegmental pedicle screw fixation local autografting.  Risks and benefits of been explained.  Patient wishes to proceed. ? ?Natasha Cole ?09/28/2021, 11:16 AM ? ? ? ? ?

## 2021-09-28 NOTE — Brief Op Note (Signed)
09/28/2021 ? ?2:42 PM ? ?PATIENT:  Natasha Cole  51 y.o. female ? ?PRE-OPERATIVE DIAGNOSIS:  Spondylolisthesis ? ?POST-OPERATIVE DIAGNOSIS:  Spondylolisthesis ? ?PROCEDURE:  Procedure(s): ?Posterior Lumbar Interbody Fusion - Lumbar Four- Lumbar Five (N/A) ? ?SURGEON:  Surgeon(s) and Role: ?   Earnie Larsson, MD - Primary ? ?PHYSICIAN ASSISTANT:  ? ?ASSISTANTS: Bergman,NP  ? ?ANESTHESIA:   general ? ?EBL:  200 mL  ? ?BLOOD ADMINISTERED:none ? ?DRAINS: (med  x 2) Hemovact drain(s) in the deep and superficial with  Suction Open  ? ?LOCAL MEDICATIONS USED:  MARCAINE    ? ?SPECIMEN:  No Specimen ? ?DISPOSITION OF SPECIMEN:  N/A ? ?COUNTS:  YES ? ?TOURNIQUET:  * No tourniquets in log * ? ?DICTATION: .Dragon Dictation ? ?PLAN OF CARE: Admit for overnight observation ? ?PATIENT DISPOSITION:  PACU - hemodynamically stable. ?  ?Delay start of Pharmacological VTE agent (>24hrs) due to surgical blood loss or risk of bleeding: yes ? ?

## 2021-09-28 NOTE — Progress Notes (Signed)
Orthopedic Tech Progress Note ?Patient Details:  ?Stephannie Peters ?01-28-1971 ?[id number] ? ?PACU RN stated " patient has back brace"  ? ?Patient ID: Zuri Lascala, female   DOB: 11/11/1970, 51 y.o.   MRN: [id number] ? ?Donald Pore ?09/28/2021, 4:12 PM ? ?

## 2021-09-29 DIAGNOSIS — M48061 Spinal stenosis, lumbar region without neurogenic claudication: Secondary | ICD-10-CM | POA: Diagnosis not present

## 2021-09-29 MED ORDER — OXYCODONE-ACETAMINOPHEN 10-325 MG PO TABS: 1.0000 | ORAL_TABLET | ORAL | 0 refills | Status: DC | PRN

## 2021-09-29 MED ORDER — METHOCARBAMOL 500 MG PO TABS
500.0000 mg | ORAL_TABLET | Freq: Four times a day (QID) | ORAL | 1 refills | Status: AC | PRN
Start: 2021-09-29 — End: ?

## 2021-09-29 NOTE — Evaluation (Signed)
Occupational Therapy Evaluation ?Patient Details ?Name: Natasha Cole ?MRN: [id number] ?DOB: 05-23-1971 ?Today's Date: 09/29/2021 ? ? ?History of Present Illness 51 yo female s/p PLIF L4-5 interbody cage with pedicle screw fixation and local autografting PMH reversal gastric bypass,ankle surgery R, panic attacks, Sleep apnea, arthritis, CKD III, DDD, Depression, kidney stones  ? ?Clinical Impression ?  ?Patient is s/p PLIF L4-5 surgery resulting in functional limitations due to the deficits listed below (see OT problem list). Pt currently requires min cues for back precautions and use of AE to maintain precautions. Pt will have family (A) upon d/c home.  Patient will benefit from skilled OT acutely to increase independence and safety with ADLS to allow discharge no follow up d/c home. ?  ?   ? ?Recommendations for follow up therapy are one component of a multi-disciplinary discharge planning process, led by the attending physician.  Recommendations may be updated based on patient status, additional functional criteria and insurance authorization.  ? ?Follow Up Recommendations ? No OT follow up  ?  ?Assistance Recommended at Discharge None  ?Patient can return home with the following A little help with bathing/dressing/bathroom;Assist for transportation ? ?  ?Functional Status Assessment ? Patient has had a recent decline in their functional status and demonstrates the ability to make significant improvements in function in a reasonable and predictable amount of time.  ?Equipment Recommendations ?    ?  ?Recommendations for Other Services   ? ? ?  ?Precautions / Restrictions Precautions ?Precautions: Back ?Precaution Booklet Issued: Yes (comment) ?Precaution Comments: handout for adls. ?Required Braces or Orthoses: Spinal Brace ?Spinal Brace: Lumbar corset;Applied in standing position  ? ?  ? ?Mobility Bed Mobility ?Overal bed mobility: Modified Independent ?  ?  ?  ?  ?  ?  ?General bed mobility comments: hob 25  degrees simulated at home hospital bed to help practice transfers without (A) ?  ? ?Transfers ?Overall transfer level: Needs assistance ?  ?Transfers: Sit to/from Stand ?Sit to Stand: Min assist ?  ?  ?  ?  ?  ?General transfer comment: using hands on bed surface to power up ?  ? ?  ?Balance Overall balance assessment: Mild deficits observed, not formally tested ?  ?  ?  ?  ?  ?  ?  ?  ?  ?  ?  ?  ?  ?  ?  ?  ?  ?  ?   ? ?ADL either performed or assessed with clinical judgement  ? ?ADL Overall ADL's : Needs assistance/impaired ?Eating/Feeding: Independent ?  ?Grooming: Independent ?Grooming Details (indicate cue type and reason): cues to avoid bending at sink . observed on entry with hygiene at sink ?  ?  ?  ?  ?  ?  ?Lower Body Dressing: Min guard;Sit to/from stand;With adaptive equipment ?Lower Body Dressing Details (indicate cue type and reason): educated on AE and plans to purchase. pt able to complete task with increased time ?Toilet Transfer: Min guard ?  ?  ?  ?  ?  ?  ?General ADL Comments: pt with questiosn about bed mobility and focused on positioning and even use of sheet under mattress as needed due to the lack of bed rails  ?Back handout provided and reviewed adls in detail. Pt educated on: clothing between brace, never sleep in brace, set an alarm at night for medication, avoid sitting for long periods of time, correct bed positioning for sleeping, correct sequence for bed mobility, avoiding lifting more  than 5 pounds and never wash directly over incision. All education is complete and patient indicates understanding. ? ? ? ?Vision Baseline Vision/History: 1 Wears glasses ?Ability to See in Adequate Light: 0 Adequate ?   ?   ?Perception   ?  ?Praxis   ?  ? ?Pertinent Vitals/Pain Pain Assessment ?Pain Assessment: Faces ?Faces Pain Scale: Hurts a little bit ?Pain Location: back ?Pain Descriptors / Indicators: Operative site guarding ?Pain Intervention(s): Premedicated before session, Repositioned   ? ? ? ?Hand Dominance Right ?  ?Extremity/Trunk Assessment Upper Extremity Assessment ?Upper Extremity Assessment: Overall WFL for tasks assessed;LUE deficits/detail ?LUE Deficits / Details: noted to have a skin tear that patient reports was not there. Dressing applied to the wound ?  ?Lower Extremity Assessment ?Lower Extremity Assessment: RLE deficits/detail ?RLE Deficits / Details: reports RLE is weaker than LLE ?  ?Cervical / Trunk Assessment ?Cervical / Trunk Assessment: Back Surgery ?  ?Communication Communication ?Communication: No difficulties ?  ?Cognition Arousal/Alertness: Awake/alert ?[facility] for tasks assessed/performed ?Overall Cognitive Status: Within Functional Limits for tasks assessed ?  ?  ?  ?  ?  ?  ?  ?  ?  ?  ?  ?  ?  ?  ?  ?  ?  ?  ?  ?General Comments  x2 hemovac drains present, able to don doff back brace without (A) ? ?  ?Exercises   ?  ?Shoulder Instructions    ? ? ?Home Living Family/patient expects to be discharged to:: Private residence ?Living Arrangements: Children ?Available Help at Discharge: Family;Available PRN/intermittently ?Type of Home: House ?Home Access: Stairs to enter ?Entrance Stairs-Number of Steps: 4 ?  ?  ?  ?  ?Bathroom Shower/Tub: Tub/shower unit ?  ?Bathroom Toilet: Standard ?  ?  ?Home Equipment: Shower seat;Adaptive equipment;Hand held shower head;Hospital bed;Toilet riser ?Adaptive Equipment: Reacher;Other (Comment) (has a toilet aide: reports has an extra long reacher but plans to buy reacher and sock aide) ?Additional Comments: german shepard dog in the home that x2 children can care for. Pt has 5 individuals that can help with recovery at home. children in the home at 6320s ?  ? ?  ?Prior Functioning/Environment Prior Level of Function : Independent/Modified Independent ?  ?  ?  ?  ?  ?  ?  ?  ?  ? ?  ?  ?OT Problem List: Decreased activity tolerance;Impaired balance (sitting and/or standing);Decreased knowledge of use of DME or  AE;Decreased knowledge of precautions;Decreased safety awareness;Obesity ?  ?   ?OT Treatment/Interventions: Self-care/ADL training;Neuromuscular education;Energy conservation;DME and/or AE instruction;Therapeutic activities;Patient/family education;Balance training  ?  ?OT Goals(Current goals can be found in the care plan section) Acute Rehab OT Goals ?Patient Stated Goal: to return home ?OT Goal Formulation: With patient/family ?Time For Goal Achievement: 10/13/21 ?Potential to Achieve Goals: Good  ?OT Frequency: Min 2X/week ?  ? ?Co-evaluation   ?  ?  ?  ?  ? ?  ?AM-PAC OT "6 Clicks" Daily Activity     ?Outcome Measure Help from another person eating meals?: None ?Help from another person taking care of personal grooming?: None ?Help from another person toileting, which includes using toliet, bedpan, or urinal?: A Little ?Help from another person bathing (including washing, rinsing, drying)?: A Little ?Help from another person to put on and taking off regular upper body clothing?: None ?Help from another person to put on and taking off regular lower body clothing?: A Little ?6 Click Score: 21 ?  ?  End of Session Equipment Utilized During Treatment: Back brace ?Nurse Communication: Mobility status;Precautions ? ?Activity Tolerance: Patient tolerated treatment well ?Patient left: in bed (Pt arriving with pt on eob and leaving in PT tx) ? ?OT Visit Diagnosis: Unsteadiness on feet (R26.81);Muscle weakness (generalized) (M62.81)  ?              ?Time: 5397-6734 ?OT Time Calculation (min): 40 min ?Charges:  OT General Charges ?$OT Visit: 1 Visit ?OT Evaluation ?$OT Eval Moderate Complexity: 1 Mod ?OT Treatments ?$Self Care/Home Management : 23-37 mins ? ? ?Brynn, OTR/L  ?Acute Rehabilitation Services ?Pager: [phone] ?Office: [phone] ?. ? ? ?Mateo Flow ?09/29/2021, 10:30 AM ?

## 2021-09-29 NOTE — Discharge Instructions (Addendum)
Wound Care ?Keep incision covered and dry for two days.    ?Do not put any creams, lotions, or ointments on incision. ?Leave steri-strips on back.  They will fall off by themselves. ?You are fine to shower. Let water run over incision and drain site and pat dry. ?Empty drain as needed to keep it charged. ? ?Activity ?Walk each and every day, increasing distance each day. ?No lifting greater than 5 lbs.  Avoid excessive back motion. ?No driving for 2 weeks; may ride as a passenger locally. ? ?Diet ?Resume your normal diet. ?  ?Return to Work ?Will be discussed at your follow up appointment. ? ?Call Your Doctor If Any of These Occur ?Redness, drainage, or swelling at the wound.  ?Temperature greater than 101 degrees. ?Severe pain not relieved by pain medication. ?Incision starts to come apart. ? ?Follow Up Appt ?Call [phone] today for appointment in 2-3 weeks if you don't already have one or for any problems. ? ? ? ?

## 2021-09-29 NOTE — Evaluation (Signed)
Physical Therapy Evaluation ?Patient Details ?Name: Natasha Cole ?MRN: [id number] ?DOB: Oct 10, 1970 ?Today's Date: 09/29/2021 ? ?History of Present Illness ? Pt is a 51 yo female s/p PLIF L4-5 interbody cage with pedicle screw fixation and local autografting PMH reversal gastric bypass,ankle surgery R, panic attacks, Sleep apnea, arthritis, CKD III, DDD, Depression, kidney stones ?  ?Clinical Impression ? Pt admitted with above diagnosis. At the time of PT eval, pt was able to demonstrate transfers and ambulation with gross min guard assist to supervision for safety and no AD. Pt was educated on precautions, brace application/wearing schedule, appropriate activity progression, and car transfer. Pt currently with functional limitations due to the deficits listed below (see PT Problem List). Pt will benefit from skilled PT to increase their independence and safety with mobility to allow discharge to the venue listed below.     ?   ? ?Recommendations for follow up therapy are one component of a multi-disciplinary discharge planning process, led by the attending physician.  Recommendations may be updated based on patient status, additional functional criteria and insurance authorization. ? ?Follow Up Recommendations No PT follow up ? ?  ?Assistance Recommended at Discharge PRN  ?Patient can return home with the following ? A little help with walking and/or transfers;Assistance with cooking/housework;Assist for transportation;Help with stairs or ramp for entrance ? ?  ?Equipment Recommendations    ?Recommendations for Other Services ?    ?  ?Functional Status Assessment Patient has had a recent decline in their functional status and demonstrates the ability to make significant improvements in function in a reasonable and predictable amount of time.  ? ?  ?Precautions / Restrictions Precautions ?Precautions: Back ?Precaution Booklet Issued: Yes (comment) ?Precaution Comments: Reviewed handout and pt was cued for  precautions during functional mobility. ?Required Braces or Orthoses: Spinal Brace ?Spinal Brace: Lumbar corset;Applied in standing position ?Restrictions ?Weight Bearing Restrictions: No  ? ?  ? ?Mobility ? Bed Mobility ?Overal bed mobility: Needs Assistance ?Bed Mobility: Rolling, Sit to Sidelying ?Rolling: Modified independent (Device/Increase time) ?  ?  ?  ?Sit to sidelying: Min assist ?General bed mobility comments: Assist for LE elevation back up into bed at end of session. VC's for optimal log roll technique. ?  ? ?Transfers ?Overall transfer level: Needs assistance ?Equipment used: None ?Transfers: Sit to/from Stand ?Sit to Stand: Min guard ?  ?  ?  ?  ?  ?General transfer comment: Close guard for safety as pt powered up to full stand. VC's for hand placement on seated surface for safety with stand>sit. ?  ? ?Ambulation/Gait ?Ambulation/Gait assistance: Min guard ?Gait Distance (Feet): 200 Feet ?Assistive device: None ?Gait Pattern/deviations: Step-through pattern, Decreased stride length, Trunk flexed ?Gait velocity: Decreased ?Gait velocity interpretation: 1.31 - 2.62 ft/sec, indicative of limited community ambulator ?  ?General Gait Details: Slow and guarded with UE support on the railing in the hall. Pt able to ambulate without railing however with UE's out in high guard position. No overt LOB noted. ? ?Stairs ?Stairs: Yes ?Stairs assistance: Min assist ?Stair Management: One rail Left, Step to pattern, Forwards ?Number of Stairs: 4 ?General stair comments: VC's for sequencing and general safety. HHA provided on the R with pt utilizing L railing. Heavy min assist through [facility] for power up to next step. On descent, pt holding to rail with BUE's and was able to negotiate stairs well. Unable to manage ascending stairs sideways due to pain. ? ?Wheelchair Mobility ?  ? ?Modified Rankin (Stroke Patients Only) ?  ? ?  ? ?  Balance Overall balance assessment: Mild deficits observed, not formally tested ?  ?  ?  ?   ?  ?  ?  ?  ?  ?  ?  ?  ?  ?  ?  ?  ?  ?  ?   ? ? ? ?Pertinent Vitals/Pain Pain Assessment ?Pain Assessment: Faces ?Faces Pain Scale: Hurts even more ?Pain Location: back ?Pain Descriptors / Indicators: Operative site guarding, Sore ?Pain Intervention(s): Limited activity within patient's tolerance, Monitored during session, Repositioned, Patient requesting pain meds-RN notified  ? ? ?Home Living Family/patient expects to be discharged to:: Private residence ?Living Arrangements: Children ?Available Help at Discharge: Family;Available PRN/intermittently ?Type of Home: House ?Home Access: Stairs to enter ?Entrance Stairs-Rails: Right;Left;Can reach both ?Entrance Stairs-Number of Steps: 4 ?  ?  ?Home Equipment: Shower seat;Adaptive equipment;Hand held shower head;Hospital bed;Toilet riser ?Additional Comments: german shepard dog in the home that x2 children can care for. Pt has 5 individuals that can help with recovery at home. children in the home at 78s  ?  ?Prior Function Prior Level of Function : Independent/Modified Independent ?  ?  ?  ?  ?  ?  ?  ?  ?  ? ? ?Hand Dominance  ? Dominant Hand: Right ? ?  ?Extremity/Trunk Assessment  ? Upper Extremity Assessment ?Upper Extremity Assessment: Defer to OT evaluation ?LUE Deficits / Details: noted to have a skin tear that patient reports was not there. Dressing applied to the wound ?  ? ?Lower Extremity Assessment ?Lower Extremity Assessment: RLE deficits/detail ?RLE Deficits / Details: Decreased strength and muscular endurance consistent with pre-op diagnosis ?  ? ?Cervical / Trunk Assessment ?Cervical / Trunk Assessment: Back Surgery  ?Communication  ? Communication: No difficulties  ?Cognition Arousal/Alertness: Awake/alert ?Behavior During Therapy: [facility] for tasks assessed/performed ?Overall Cognitive Status: Within Functional Limits for tasks assessed ?  ?  ?  ?  ?  ?  ?  ?  ?  ?  ?  ?  ?  ?  ?  ?  ?  ?  ?  ? ?  ?General Comments General comments (skin integrity,  edema, etc.): x2 hemovac drains present, able to don doff back brace without (A) ? ?  ?Exercises    ? ?Assessment/Plan  ?  ?PT Assessment Patient needs continued PT services  ?PT Problem List Decreased strength;Decreased activity tolerance;Decreased balance;Decreased mobility;Decreased knowledge of use of DME;Decreased safety awareness;Decreased knowledge of precautions;Pain ? ?   ?  ?PT Treatment Interventions DME instruction;Gait training;Stair training;Functional mobility training;Therapeutic activities;Therapeutic exercise;Balance training;Patient/family education   ? ?PT Goals (Current goals can be found in the Care Plan section)  ?Acute Rehab PT Goals ?Patient Stated Goal: Home today ?PT Goal Formulation: With patient/family ?Time For Goal Achievement: 10/06/21 ?Potential to Achieve Goals: Good ? ?  ?Frequency Min 5X/week ?  ? ? ?Co-evaluation   ?  ?  ?  ?  ? ? ?  ?AM-PAC PT "6 Clicks" Mobility  ?Outcome Measure Help needed turning from your back to your side while in a flat bed without using bedrails?: A Little ?Help needed moving from lying on your back to sitting on the side of a flat bed without using bedrails?: A Little ?Help needed moving to and from a bed to a chair (including a wheelchair)?: A Little ?Help needed standing up from a chair using your arms (e.g., wheelchair or bedside chair)?: A Little ?Help needed to walk in hospital room?: A Little ?Help needed climbing 3-5  steps with a railing? : A Little ?6 Click Score: 18 ? ?  ?End of Session Equipment Utilized During Treatment: Back brace ?Activity Tolerance: Patient tolerated treatment well ?Patient left: in bed;with call bell/phone within reach;with family/visitor present ?Nurse Communication: Mobility status ?PT Visit Diagnosis: Unsteadiness on feet (R26.81);Pain ?Pain - part of body:  (back) ?  ? ?Time: [id number] ?PT Time Calculation (min) (ACUTE ONLY): 26 min ? ? ?Charges:   PT Evaluation ?$PT Eval Low Complexity: 1 Low ?PT Treatments ?$Gait  Training: 8-22 mins ?  ?   ? ? ?Natasha Cole, PT, DPT ?Acute Rehabilitation Services ?Secure Chat Preferred ?Office: [phone]  ? ?Thelma Comp ?09/29/2021, 11:22 AM ? ?

## 2021-09-29 NOTE — Discharge Summary (Signed)
Physician Discharge Summary  ? ? ? ?Providing Compassionate, Quality Care - Together ? ? ?Patient ID: ?Natasha Cole ?MRN: [id number] ?DOB/AGE: [birth year] 51 y.o. ? ?Admit date: 09/28/2021 ?Discharge date: 09/29/2021 ? ?Admission Diagnoses: Degenerative spondylolisthesis ? ?Discharge Diagnoses:  ?Principal Problem: ?  Degenerative spondylolisthesis ? ? ?Discharged Condition: good ? ?Hospital Course: Patient underwent an L4-5 PLIF by Dr. Jordan Likes on 09/28/2021. She was admitted to 3C07 following recovery from anesthesia in the PACU. Her postoperative course has been uncomplicated. She has worked with both physical and occupational therapies who feel the patient is ready for discharge home. She is ambulating independently and without difficulty. She is tolerating a normal diet. She is not having any bowel or bladder dysfunction. Her pain is well-controlled with oral pain medication. She is ready for discharge home. ? ? ?Consults: PT/OT ? ?Significant Diagnostic Studies: radiology: DG Lumbar Spine 2-3 Views ? ?Result Date: 09/28/2021 ?CLINICAL DATA:  Intraoperative imaging of L4-5 fixation EXAM: LUMBAR SPINE - 2-3 VIEW COMPARISON:  MRI 09/13/2021 FINDINGS: Two intraoperative views. The first is labeled 1343 hours. Under penetrated, with surgical devices projecting posterior to the lumbar spine, likely L4 and L5. The second image, labeled 1451 hours, demonstrates interval trans pedicle screw fixation with interbody fusion material, at L4-5. This is also under penetrated. IMPRESSION: Intraoperative imaging of L4-5 fixation. Electronically Signed   By: Jeronimo Greaves M.D.   On: 09/28/2021 14:41  ? ?DG Lumbar Spine 1 View ? ?Result Date: 09/28/2021 ?CLINICAL DATA:  Post PLIF at L4-5. EXAM: LUMBAR SPINE - 1 VIEW; DG C-ARM 1-60 MIN-NO REPORT COMPARISON:  September 16, 2021. FINDINGS: A single intraoperative spot view in the AP projection is presented showing bilateral pedicle screws and interbody cage placement across a single disc space  Comparison with subsequent imaging shows this corresponds to L4-5. Localizing on this single projection alone is limited. Gas is present in the soft tissues with retractors in place lateral to the spine on either side. IMPRESSION: Intraoperative spot view of the lumbar spine as described. Post placement of bilateral pedicle screws and interbody cages. Assessment of subsequent imaging shows changes present at L4-5 difficult to localize on coned in projection. Fluoroscopic dose: 51.36 mGy Fluoroscopic time: 32 seconds. Electronically Signed   By: Donzetta Kohut M.D.   On: 09/28/2021 14:41  ? ?DG C-Arm 1-60 Min-No Report ? ?Result Date: 09/28/2021 ?CLINICAL DATA:  Post PLIF at L4-5. EXAM: LUMBAR SPINE - 1 VIEW; DG C-ARM 1-60 MIN-NO REPORT COMPARISON:  September 16, 2021. FINDINGS: A single intraoperative spot view in the AP projection is presented showing bilateral pedicle screws and interbody cage placement across a single disc space Comparison with subsequent imaging shows this corresponds to L4-5. Localizing on this single projection alone is limited. Gas is present in the soft tissues with retractors in place lateral to the spine on either side. IMPRESSION: Intraoperative spot view of the lumbar spine as described. Post placement of bilateral pedicle screws and interbody cages. Assessment of subsequent imaging shows changes present at L4-5 difficult to localize on coned in projection. Fluoroscopic dose: 51.36 mGy Fluoroscopic time: 32 seconds. Electronically Signed   By: Donzetta Kohut M.D.   On: 09/28/2021 14:41  ? ?DG C-Arm 1-60 Min-No Report ? ?Result Date: 09/28/2021 ?Fluoroscopy was utilized by the requesting physician.  No radiographic interpretation.   ? ? ?Treatments: surgery:  Bilateral L4-5 decompressive laminotomies and foraminotomies, more than would be required for simple interbody fusion alone. ? ?L4-5 posterior lumbar interbody fusion utilizing interbody cages, locally harvested  autograft, and morselized  allograft (11 mm [employer] expandable cage) ? ?L4-5 posterior lateral arthrodesis utilizing nonsegmental pedicle screw fixation and local autografting (5.75 mm radius brand screws from [employer]). ?  ? ?Discharge Exam: ?Blood pressure 139/68, pulse 73, temperature 98.2 ?F (36.8 ?C), temperature source Oral, resp. rate 18, height 5\' 3"  (1.6 m), weight (!) 158.8 kg, SpO2 99 %. ? ?Alert and oriented x 4 ?PERRLA ?CN II-XII grossly intact ?MAE, Strength and sensation intact ?Incision is covered with Honeycomb dressing and Steri Strips; Dressing is clean, dry, and intact ?2 Hemovac drains in place. One to be removed prior to discharge. ? ?Disposition: Discharge disposition: 01-Home or Self Care ? ? ? ? ? ? ? ?Allergies as of 09/29/2021   ? ?   Reactions  ? Imipramine Itching  ? Nsaids   ? Has to be careful 'because of my kidneys.'  ? ?  ? ?  ?Medication List  ?  ? ?TAKE these medications   ? ?amLODipine 5 MG tablet ?Commonly known as: NORVASC ?Take 5 mg by mouth daily. ?  ?Biotin [id number] MCG Tabs ?Take 10,000 mcg by mouth daily. ?  ?busPIRone 30 MG tablet ?Commonly known as: BUSPAR ?TAKE 1 TABLET BY MOUTH TWICE DAILY, GENERIC EQUIVALENT FOR BUSPAR ?  ?EPINEPHrine 0.3 mg/0.3 mL Soaj injection ?Commonly known as: EPI-PEN ?Use as directed for life threatening allergic reactions only ?  ?ferrous sulfate 325 (65 FE) MG tablet ?Take 325 mg by mouth every evening. ?  ?hydrOXYzine 25 MG tablet ?Commonly known as: ATARAX ?Take 1 tablet (25 mg total) by mouth 2 (two) times daily. ?What changed:  ?how much to take ?when to take this ?  ?levothyroxine 50 MCG tablet ?Commonly known as: SYNTHROID ?Take 50 mcg by mouth daily before breakfast. ?  ?lithium carbonate 150 MG capsule ?Take 3 capsules (450 mg total) by mouth daily. ?  ?medroxyPROGESTERone 2.5 MG tablet ?Commonly known as: PROVERA ?Take 5 mg by mouth daily. ?  ?methocarbamol 500 MG tablet ?Commonly known as: Robaxin ?Take 1 tablet (500 mg total) by mouth every 6 (six) hours  as needed for muscle spasms. ?  ?metoprolol succinate 25 MG 24 hr tablet ?Commonly known as: TOPROL-XL ?Take 25 mg by mouth daily. ?  ?mirtazapine 45 MG tablet ?Commonly known as: REMERON ?Take 1 tablet (45 mg total) by mouth at bedtime. ?  ?omeprazole 20 MG capsule ?Commonly known as: PRILOSEC ?Take 20 mg by mouth daily. ?  ?oxyCODONE-acetaminophen 10-325 MG tablet ?Commonly known as: Percocet ?Take 1 tablet by mouth every 4 (four) hours as needed for pain. ?  ?pramipexole 0.125 MG tablet ?Commonly known as: MIRAPEX ?Take 2 tablets (0.25 mg total) by mouth 2 (two) times daily. ?  ?rosuvastatin 10 MG tablet ?Commonly known as: CRESTOR ?TAKE 1 TABLET BY MOUTH ONCE DAILY ?  ?tolterodine 2 MG tablet ?Commonly known as: DETROL ?Take 2 mg by mouth 2 (two) times daily. ?  ?Vitamin D 50 MCG (2000 UT) tablet ?Take 2,000 Units by mouth daily. ?  ?Vitamin E 180 MG (400 UNIT) Caps ?Take 400 Units by mouth daily. ?  ? ?  ? ?  ?  ? ? ?  ?Durable Medical Equipment  ?(From admission, onward)  ?  ? ? ?  ? ?  Start     Ordered  ? 09/28/21 1700  DME Walker rolling  Once       ?Question:  Patient needs a walker to treat with the following condition  Answer:  Degenerative  spondylolisthesis  ? 09/28/21 1659  ? 09/28/21 1700  DME 3 n 1  Once       ? 09/28/21 1659  ? ?  ?  ? ?  ? ? Follow-up Information   ? ? Earnie Larsson, MD. Daphane Shepherd on 10/13/2021.   ?Specialty: Neurosurgery ?Why: First post op appointment is on 10/13/2021 at 2:45 pm. ?Contact information: ?[address] ?[address] ?[place] [address] ?[phone] ? ? ?  ?  ? ?  ?  ? ?  ? ? ?Signed: ?Viona Gilmore, DNP, AGNP-C ?Nurse Practitioner ? ?[facility] ?[address], [place], [place] [address] ?PRN:[id number]    FTJ:[id number] ? ?09/29/2021, 10:30 AM ? ?

## 2021-09-29 NOTE — Plan of Care (Signed)
Pt doing well. Pt given D/C instructions with verbal understanding. Rx's were sent to the pharmacy by MD. Pt's incision is clean and dry with no sign of infection. Pt's IV and 1 Hemovac drain were removed prior to D/C. 1 Hemovac drain will remain in at D/C per MD order. Pt instructed on how to care/empty drain at home. Pt D/C'd home via wheelchair per MD order. Pt is stable @ D/C and has no other needs at this time. Rema Fendt, RN  ?

## 2021-10-01 MED FILL — Sodium Chloride IV Soln 0.9%: INTRAVENOUS | Qty: 1000 | Status: AC

## 2021-10-01 MED FILL — Heparin Sodium (Porcine) Inj 1000 Unit/ML: INTRAMUSCULAR | Qty: 30 | Status: AC

## 2021-12-16 ENCOUNTER — Other Ambulatory Visit: Payer: Self-pay | Admitting: Physician Assistant

## 2021-12-23 ENCOUNTER — Ambulatory Visit: Payer: Medicare Other | Admitting: Physician Assistant

## 2021-12-31 ENCOUNTER — Encounter: Payer: Self-pay | Admitting: Physician Assistant

## 2021-12-31 ENCOUNTER — Ambulatory Visit: Payer: Medicare Other | Admitting: Physician Assistant

## 2021-12-31 DIAGNOSIS — G4700 Insomnia, unspecified: Secondary | ICD-10-CM | POA: Diagnosis not present

## 2021-12-31 DIAGNOSIS — F3342 Major depressive disorder, recurrent, in full remission: Secondary | ICD-10-CM | POA: Diagnosis not present

## 2021-12-31 DIAGNOSIS — G2581 Restless legs syndrome: Secondary | ICD-10-CM

## 2021-12-31 DIAGNOSIS — F411 Generalized anxiety disorder: Secondary | ICD-10-CM | POA: Diagnosis not present

## 2021-12-31 MED ORDER — LITHIUM CARBONATE 150 MG PO CAPS: 450.0000 mg | ORAL_CAPSULE | Freq: Every day | ORAL | 1 refills | Status: DC

## 2021-12-31 MED ORDER — PRAMIPEXOLE DIHYDROCHLORIDE 0.125 MG PO TABS: 0.2500 mg | ORAL_TABLET | Freq: Two times a day (BID) | ORAL | 1 refills | Status: DC

## 2021-12-31 NOTE — Progress Notes (Signed)
[facility]  Patient ID: Natasha Cole,  MRN: [id number]  PCP: Krystal Clark, NP   Date of Evaluation: 12/31/2021 Time spent:30 minutes  Chief Complaint:  anxiety, depression, insomnia  HISTORY/CURRENT STATUS: For routine med check.  Doing well as far as her mental health goes.  She had lumbar spine surgery about 3 months ago and has healed well.  When she was cleared from her surgeon she did some cleaning in her home and aggravated her back, is in a bit more pain now but hopes that will go away with time.  She is able to enjoy things.  Energy and motivation are good.  Appetite is normal and weight is stable.  ADLs and personal hygiene are normal.  No suicidal or homicidal thoughts.  Patient denies increased energy with decreased need for sleep, no increased talkativeness, no racing thoughts, no impulsivity or risky behaviors, no increased spending, no increased libido, no grandiosity, no increased irritability or anger, and no hallucinations.  Anxiety is well treated with the BuSpar.  Sometimes she will get anxious depending on the situation.  Will feel overwhelmed but no panic attacks.  Sleeps well most of the time.  Not having a lot of symptoms with restless legs.  Review of Systems  Constitutional: Negative.   HENT: Negative.    Eyes: Negative.   Respiratory: Negative.    Cardiovascular: Negative.   Gastrointestinal: Negative.   Genitourinary: Negative.   Musculoskeletal:  Positive for back pain.       See HPI  Skin: Negative.   Neurological: Negative.   Endo/Heme/Allergies: Negative.   Psychiatric/Behavioral:         See HPI   Individual Medical History/ Review of Systems: Changes? :Yes   had L4-5 fixation in April.   Past medications for mental health diagnoses include: Lithium, Seroquel, Latuda, Abilify, Remeron, BuSpar, Effexor, Deplin, Rexulti, Risperdal, Cymbalta, Prozac, Pristiq, Lamictal, Wellbutrin, trazodone, Viibryd, Zoloft, Mirapex,  Klonopin, imipramine caused rash and itching  Allergies: Imipramine and Nsaids  Current Medications:  Current Outpatient Medications:    amLODipine (NORVASC) 5 MG tablet, Take 5 mg by mouth daily., Disp: , Rfl:    Biotin 56314 MCG TABS, Take 10,000 mcg by mouth daily., Disp: , Rfl:    busPIRone (BUSPAR) 30 MG tablet, TAKE 1 TABLET BY MOUTH TWICE DAILY GENERIC EQUIVALENT FOR BUSPAR, Disp: 180 tablet, Rfl: 1   Cholecalciferol (VITAMIN D) 50 MCG (2000 UT) tablet, Take 2,000 Units by mouth daily., Disp: , Rfl:    EPINEPHrine 0.3 mg/0.3 mL IJ SOAJ injection, Use as directed for life threatening allergic reactions only, Disp: 2 each, Rfl: 3   ferrous sulfate 325 (65 FE) MG tablet, Take 325 mg by mouth every evening., Disp: , Rfl:    hydrOXYzine (ATARAX) 25 MG tablet, TAKE 1 TABLET BY MOUTH 2 TIMES DAILY, Disp: 180 tablet, Rfl: 1   levothyroxine (SYNTHROID, LEVOTHROID) 50 MCG tablet, Take 50 mcg by mouth daily before breakfast., Disp: , Rfl:    medroxyPROGESTERone (PROVERA) 2.5 MG tablet, Take 5 mg by mouth daily., Disp: , Rfl:    metoprolol succinate (TOPROL-XL) 25 MG 24 hr tablet, Take 25 mg by mouth daily., Disp: , Rfl:    mirtazapine (REMERON) 45 MG tablet, TAKE 1 TABLET BY MOUTH AT BEDTIME, Disp: 90 tablet, Rfl: 1   omeprazole (PRILOSEC) 20 MG capsule, Take 20 mg by mouth daily., Disp: , Rfl:    rosuvastatin (CRESTOR) 10 MG tablet, TAKE 1 TABLET BY MOUTH ONCE DAILY, Disp: , Rfl:  tolterodine (DETROL) 2 MG tablet, Take 2 mg by mouth 2 (two) times daily., Disp: , Rfl:    Vitamin E 180 MG (400 UNIT) CAPS, Take 400 Units by mouth daily., Disp: , Rfl:    lithium carbonate 150 MG capsule, Take 3 capsules (450 mg total) by mouth daily., Disp: 270 capsule, Rfl: 1   methocarbamol (ROBAXIN) 500 MG tablet, Take 1 tablet (500 mg total) by mouth every 6 (six) hours as needed for muscle spasms. (Patient not taking: Reported on 12/31/2021), Disp: 30 tablet, Rfl: 1   oxyCODONE-acetaminophen (PERCOCET) 10-325 MG  tablet, Take 1 tablet by mouth every 4 (four) hours as needed for pain. (Patient not taking: Reported on 12/31/2021), Disp: 30 tablet, Rfl: 0   pramipexole (MIRAPEX) 0.125 MG tablet, Take 2 tablets (0.25 mg total) by mouth 2 (two) times daily., Disp: 360 tablet, Rfl: 1  Current Facility-Administered Medications:    omalizumab Geoffry Paradise) injection 300 mg, 300 mg, Subcutaneous, Q28 days, Kozlow, Alvira Philips, MD, 300 mg at 04/17/20 0930 Medication Side Effects: none  Family Medical/ Social History: Changes? No  MENTAL HEALTH EXAM:  There were no vitals taken for this visit.There is no height or weight on file to calculate BMI.  General Appearance: Casual, Well Groomed, and Obese  Eye Contact:  Good  Speech:  Clear and Coherent  Volume:  Normal  Mood:  Euthymic  Affect:  Congruent  Thought Process:  Goal Directed and Descriptions of Associations: Circumstantial  Orientation:  Full (Time, Place, and Person)  Thought Content: Logical   Suicidal Thoughts:  No  Homicidal Thoughts:  No  Memory:  WNL  Judgement:  Good  Insight:  Good  Psychomotor Activity:  Normal  Concentration:  Concentration: Good and Attention Span: Good  Recall:  Good  Fund of Knowledge: Good  Language: Good  Assets:  Desire for Improvement  ADL's:  Intact  Cognition: WNL  Prognosis:  Good   Most recent pertinent Labs  05/12/2021 TSH 3.63  06/25/2021 Lithium level 0.5  09/14/2021 BMP creatinine 1.33, BUN 12, GFR 48, glucose 116  DIAGNOSES:    ICD-10-CM   1. Recurrent major depression in full remission (HCC)  F33.42     2. Generalized anxiety disorder  F41.1     3. Restless leg syndrome  G25.81     4. Insomnia, unspecified type  G47.00       Receivng Psychotherapy: No    RECOMMENDATIONS:  PDMP was reviewed.  Oxycodone and tramadol in the past year. I provided 30 minutes of face to face time during this encounter, including time spent before and after the visit in records review, medical decision  making, counseling pertinent to today's visit, and charting.   She is doing well as far as her medications go so no changes are necessary.    We discussed the slightly elevated creatinine level in March.  That needs to be repeated since she is on lithium, at higher risk for kidney issues even though she is on a low dose of lithium.  She will be seeing her primary provider in the next few weeks and will have labs drawn then, therefore I will not order those.  I have asked her to get her provider to send me those results.  No reason to order a lithium level at this time.  Continue BuSpar 30 mg, 1 p.o. twice daily. Continue hydroxyzine 25 mg, 1 p.o. twice daily as needed. Continue levothyroxine Rx by PCP. Continue lithium 150 mg, 3 p.o. nightly. Continue  mirtazapine 45 mg, 1 p.o. nightly. Continue pramipexole 0.125 mg, 2 p.o. twice daily. Return in 6 months.  Melony Overly, PA-C

## 2022-02-08 HISTORY — PX: UPPER GI ENDOSCOPY: SHX6162

## 2022-04-19 ENCOUNTER — Encounter: Payer: Self-pay | Admitting: Physician Assistant

## 2022-04-19 ENCOUNTER — Ambulatory Visit: Payer: Medicare Other | Admitting: Physician Assistant

## 2022-04-19 DIAGNOSIS — F331 Major depressive disorder, recurrent, moderate: Secondary | ICD-10-CM | POA: Diagnosis not present

## 2022-04-19 DIAGNOSIS — G2581 Restless legs syndrome: Secondary | ICD-10-CM | POA: Diagnosis not present

## 2022-04-19 DIAGNOSIS — Z79899 Other long term (current) drug therapy: Secondary | ICD-10-CM

## 2022-04-19 DIAGNOSIS — F411 Generalized anxiety disorder: Secondary | ICD-10-CM

## 2022-04-19 DIAGNOSIS — G4700 Insomnia, unspecified: Secondary | ICD-10-CM

## 2022-04-19 DIAGNOSIS — R7989 Other specified abnormal findings of blood chemistry: Secondary | ICD-10-CM

## 2022-04-19 MED ORDER — B-COMPLEX PO CAPS: 1.0000 | ORAL_CAPSULE | Freq: Every day | ORAL | Status: AC

## 2022-04-19 MED ORDER — VITAMIN D (ERGOCALCIFEROL) 1.25 MG (50000 UNIT) PO CAPS: 50000.0000 [IU] | ORAL_CAPSULE | ORAL | 1 refills | Status: DC

## 2022-04-19 MED ORDER — MIRTAZAPINE 15 MG PO TABS: ORAL_TABLET | ORAL | 1 refills | Status: DC

## 2022-04-19 NOTE — Patient Instructions (Addendum)
Add B complex daily.  This is over-the-counter. We are increasing mirtazapine.  Continue to take 45 mg plus the 15 mg that I am prescribing today.  For the first week break the 15 mg pill in half and take that daily and then after that take the whole 15 mg with the 45 mg. Starting vitamin D prescription strength today.  I have sent that to your pharmacy.  It will be once a week but continue your daily over-the-counter dose as well. Check labs in 6 weeks from now and then follow-up appointment in 7-8 weeks Recommend getting a mood therapy light. Sit in front of it 30 minutes a day.

## 2022-04-19 NOTE — Progress Notes (Signed)
[facility]  Patient ID: Natasha Cole,  MRN: [id number]  PCP: Mayer Camel, NP   Date of Evaluation: 04/19/2022 Time spent:40 minutes  Chief Complaint:  anxiety, depression, insomnia  HISTORY/CURRENT STATUS: Not doing well.   Mood is 'dropping' in the past month or so. Anhedonia. Not motivated to cook or clean. She does enjoy crocheting and being around her kids and grandkids. Doesn't enjoy anything else though. Sleeps ok, not too much. Would like to isolate but can't. She puts on a 'mask' so she won't let everybody know how she really feels.  Personal hygiene is normal.  Appetite is normal.  Weight is stable.  States she has episodes like this every once in a while but she is usually able to bring herself out of it, but not this time.  Denies suicidal or homicidal thoughts.  Does have anxiety, generalized.  Feels overwhelmed quite often.  Not always triggered by anything specific.  No panic attacks.  Has a sense of unease, like something bad may happen.  Patient denies increased energy with decreased need for sleep, increased talkativeness, racing thoughts, impulsivity or risky behaviors, increased spending, increased libido, grandiosity, increased irritability or anger, paranoia, or hallucinations.  Review of Systems  Constitutional:  Positive for malaise/fatigue.  HENT: Negative.    Eyes: Negative.   Respiratory: Negative.    Cardiovascular: Negative.   Gastrointestinal: Negative.   Genitourinary: Negative.   Musculoskeletal:  Positive for back pain and myalgias.       Chronic back pain  Skin: Negative.   Neurological: Negative.   Endo/Heme/Allergies: Negative.   Psychiatric/Behavioral:         See HPI   Individual Medical History/ Review of Systems: Changes? :No       Past medications for mental health diagnoses include: Lithium (dose higher than 450 mg daily caused slurred speech) Seroquel, Latuda, Abilify, Remeron, BuSpar, Effexor, Deplin,  Rexulti, Risperdal, Cymbalta, Prozac, Pristiq, Lamictal, Wellbutrin, trazodone, Viibryd, Zoloft, Mirapex, Klonopin, imipramine caused rash and itching  Allergies: Imipramine and Nsaids  Current Medications:  Current Outpatient Medications:    amLODipine (NORVASC) 5 MG tablet, Take 5 mg by mouth daily., Disp: , Rfl:    B-Complex CAPS, Take 1 capsule by mouth daily., Disp: 30 capsule, Rfl:    Biotin 10000 MCG TABS, Take 10,000 mcg by mouth daily., Disp: , Rfl:    busPIRone (BUSPAR) 30 MG tablet, TAKE 1 TABLET BY MOUTH TWICE DAILY GENERIC EQUIVALENT FOR BUSPAR, Disp: 180 tablet, Rfl: 1   Cholecalciferol (VITAMIN D) 50 MCG (2000 UT) tablet, Take 2,000 Units by mouth daily., Disp: , Rfl:    ferrous sulfate 325 (65 FE) MG tablet, Take 325 mg by mouth every evening., Disp: , Rfl:    hydrOXYzine (ATARAX) 25 MG tablet, TAKE 1 TABLET BY MOUTH 2 TIMES DAILY, Disp: 180 tablet, Rfl: 1   levothyroxine (SYNTHROID, LEVOTHROID) 50 MCG tablet, Take 50 mcg by mouth daily before breakfast., Disp: , Rfl:    lithium carbonate 150 MG capsule, Take 3 capsules (450 mg total) by mouth daily., Disp: 270 capsule, Rfl: 1   medroxyPROGESTERone (PROVERA) 2.5 MG tablet, Take 5 mg by mouth daily., Disp: , Rfl:    metoprolol succinate (TOPROL-XL) 25 MG 24 hr tablet, Take 25 mg by mouth daily., Disp: , Rfl:    mirtazapine (REMERON) 15 MG tablet, 1/2 po qd  with the 45 mg for one week then 1 po with 45 mg qhs= 60 mg, Disp: 30 tablet, Rfl: 1   mirtazapine (  REMERON) 45 MG tablet, TAKE 1 TABLET BY MOUTH AT BEDTIME, Disp: 90 tablet, Rfl: 1   omeprazole (PRILOSEC) 20 MG capsule, Take 20 mg by mouth daily., Disp: , Rfl:    pramipexole (MIRAPEX) 0.125 MG tablet, Take 2 tablets (0.25 mg total) by mouth 2 (two) times daily., Disp: 360 tablet, Rfl: 1   rosuvastatin (CRESTOR) 10 MG tablet, TAKE 1 TABLET BY MOUTH ONCE DAILY, Disp: , Rfl:    tolterodine (DETROL) 2 MG tablet, Take 2 mg by mouth 2 (two) times daily., Disp: , Rfl:    Vitamin D,  Ergocalciferol, (DRISDOL) 1.25 MG (50000 UNIT) CAPS capsule, Take 1 capsule (50,000 Units total) by mouth every 7 (seven) days., Disp: 12 capsule, Rfl: 1   Vitamin E 180 MG (400 UNIT) CAPS, Take 400 Units by mouth daily., Disp: , Rfl:    EPINEPHrine 0.3 mg/0.3 mL IJ SOAJ injection, Use as directed for life threatening allergic reactions only (Patient not taking: Reported on 04/19/2022), Disp: 2 each, Rfl: 3   methocarbamol (ROBAXIN) 500 MG tablet, Take 1 tablet (500 mg total) by mouth every 6 (six) hours as needed for muscle spasms. (Patient not taking: Reported on 12/31/2021), Disp: 30 tablet, Rfl: 1   oxyCODONE-acetaminophen (PERCOCET) 10-325 MG tablet, Take 1 tablet by mouth every 4 (four) hours as needed for pain. (Patient not taking: Reported on 12/31/2021), Disp: 30 tablet, Rfl: 0  Current Facility-Administered Medications:    omalizumab Geoffry Paradise) injection 300 mg, 300 mg, Subcutaneous, Q28 days, Kozlow, Alvira Philips, MD, 300 mg at 04/17/20 0930 Medication Side Effects: none  Family Medical/ Social History: Changes? No  MENTAL HEALTH EXAM:  There were no vitals taken for this visit.There is no height or weight on file to calculate BMI.  General Appearance: Casual, Well Groomed, and Obese  Eye Contact:  Good  Speech:  Clear and Coherent  Volume:  Normal  Mood:  Depressed  Affect:  Depressed and Tearful  Thought Process:  Goal Directed and Descriptions of Associations: Circumstantial  Orientation:  Full (Time, Place, and Person)  Thought Content: Logical   Suicidal Thoughts:  No  Homicidal Thoughts:  No  Memory:  WNL  Judgement:  Good  Insight:  Good  Psychomotor Activity:  Normal  Concentration:  Concentration: Good and Attention Span: Good  Recall:  Good  Fund of Knowledge: Good  Language: Good  Assets:  Desire for Improvement  ADL's:  Intact  Cognition: WNL  Prognosis:  Good   Labs 01/20/2022 CBC with differential essentially normal CMP glucose 92, BUN 14, creatinine 1.37, GFR 47  otherwise all normal Hemoglobin A1c 5.8 Lipid panel LDL 173, cholesterol 225, HDL 46, triglycerides 110 Vitamin D level 27  03/19/2022 Vitamin D 38  DIAGNOSES:    ICD-10-CM   1. Major depressive disorder, recurrent episode, moderate (HCC)  F33.1 VITAMIN D 25 Hydroxy (Vit-D Deficiency, Fractures)    Basic metabolic panel    Lithium level    2. Generalized anxiety disorder  F41.1     3. Insomnia, unspecified type  G47.00     4. Restless leg syndrome  G25.81     5. Encounter for long-term (current) use of medications  Z79.899 VITAMIN D 25 Hydroxy (Vit-D Deficiency, Fractures)    Basic metabolic panel    Lithium level    6. Low vitamin D level  R79.89 VITAMIN D 25 Hydroxy (Vit-D Deficiency, Fractures)     Receivng Psychotherapy: No   RECOMMENDATIONS:  PDMP was reviewed.  Oxycodone and tramadol in the past  year. I provided 40 minutes of face to face time during this encounter, including time spent before and after the visit in records review, medical decision making, counseling pertinent to today's visit, and charting.   Discussed options for treating depression.  I recommend either increasing the lithium level or mirtazapine.  In the past when she has been on lithium 600 mg or above she had slurred speech so prefers not to change that.  Increasing the mirtazapine would put her at higher than recommended dose per FDA, but according to [facility] prescribers guide, sometimes up to 90 mg is required in some patients.  Patient understands this and would like to increase the dose.  Another option would be to change mirtazapine to another medication but neither of [place] want to do that right off the bat.  This combination has been the most effective of anything she has ever taken.  Her vitamin D level was low in July and low normal at a repeat last month.  Explained that we would like it at least 50 or above but 60 or higher is beneficial to mental health.  Since she had the low normal  vitamin D level last month I will go ahead and start prescription strength vitamin D.  Also discussed mood therapy lamp and adding B complex.  Start B complex, 1 p.o. daily. Continue BuSpar 30 mg, 1 p.o. twice daily. Continue hydroxyzine 25 mg, 1 p.o. twice daily as needed. Continue levothyroxine Rx by PCP. Continue lithium 150 mg, 3 p.o. nightly. Increase mirtazapine to 52.5 mg for 1 week, then up to 60 mg. Continue pramipexole 0.125 mg, 2 p.o. twice daily. Labs to be drawn in approximately 6 weeks. Return in 7 to 8 weeks.  Melony Overly, PA-C

## 2022-04-27 ENCOUNTER — Ambulatory Visit: Payer: Medicare Other | Admitting: Physician Assistant

## 2022-06-02 ENCOUNTER — Other Ambulatory Visit: Payer: Self-pay

## 2022-06-02 LAB — BASIC METABOLIC PANEL
BUN/Creatinine Ratio: 10 (ref 9–23)
BUN: 15 mg/dL (ref 6–24)
CO2: 21 mmol/L (ref 20–29)
Calcium: 9.7 mg/dL (ref 8.7–10.2)
Chloride: 105 mmol/L (ref 96–106)
Creatinine, Ser: 1.56 mg/dL — ABNORMAL HIGH (ref 0.57–1.00)
Glucose: 106 mg/dL — ABNORMAL HIGH (ref 70–99)
Potassium: 5 mmol/L (ref 3.5–5.2)
Sodium: 142 mmol/L (ref 134–144)
eGFR: 40 mL/min/{1.73_m2} — ABNORMAL LOW (ref >59)

## 2022-06-02 LAB — VITAMIN D 25 HYDROXY (VIT D DEFICIENCY, FRACTURES): Vit D, 25-Hydroxy: 42.7 ng/mL (ref 30.0–100.0)

## 2022-06-02 LAB — LITHIUM LEVEL: Lithium Lvl: 0.6 mmol/L (ref 0.5–1.2)

## 2022-06-02 MED ORDER — MIRTAZAPINE 15 MG PO TABS: ORAL_TABLET | ORAL | 0 refills | Status: DC

## 2022-06-02 MED ORDER — BUSPIRONE HCL 30 MG PO TABS: ORAL_TABLET | ORAL | 1 refills | Status: DC

## 2022-06-02 MED ORDER — MIRTAZAPINE 45 MG PO TABS: 45.0000 mg | ORAL_TABLET | Freq: Every day | ORAL | 1 refills | Status: DC

## 2022-06-07 NOTE — Progress Notes (Signed)
Vitamin D level is about the same even with the Rx supplement. Have her start taking it twice a week, and I'll discuss with her at routine visit, next month. Lithium level is good, no change in treatment. BMP is stable. Thanks.

## 2022-06-08 ENCOUNTER — Ambulatory Visit: Payer: Medicare Other | Admitting: Physician Assistant

## 2022-06-08 NOTE — Progress Notes (Signed)
Pt informed

## 2022-06-11 ENCOUNTER — Other Ambulatory Visit: Payer: Self-pay

## 2022-06-11 MED ORDER — HYDROXYZINE HCL 25 MG PO TABS: 25.0000 mg | ORAL_TABLET | Freq: Two times a day (BID) | ORAL | 1 refills | Status: DC

## 2022-06-15 ENCOUNTER — Other Ambulatory Visit: Payer: Self-pay | Admitting: Physician Assistant

## 2022-07-06 ENCOUNTER — Ambulatory Visit: Payer: Medicare Other | Admitting: Physician Assistant

## 2022-07-20 ENCOUNTER — Other Ambulatory Visit: Payer: Self-pay | Admitting: Physician Assistant

## 2022-07-21 ENCOUNTER — Other Ambulatory Visit: Payer: Self-pay

## 2022-07-21 NOTE — Telephone Encounter (Signed)
Ok to send

## 2022-07-22 ENCOUNTER — Other Ambulatory Visit: Payer: Self-pay

## 2022-07-22 ENCOUNTER — Telehealth: Payer: Self-pay | Admitting: Physician Assistant

## 2022-07-22 DIAGNOSIS — R7989 Other specified abnormal findings of blood chemistry: Secondary | ICD-10-CM

## 2022-07-22 MED ORDER — VITAMIN D 50 MCG (2000 UT) PO TABS: 2000.0000 [IU] | ORAL_TABLET | Freq: Every day | ORAL | 11 refills | Status: DC

## 2022-07-22 MED ORDER — VITAMIN D (ERGOCALCIFEROL) 1.25 MG (50000 UNIT) PO CAPS: 50000.0000 [IU] | ORAL_CAPSULE | ORAL | 1 refills | Status: DC

## 2022-07-22 NOTE — Telephone Encounter (Signed)
TC to patient to confirm pt is to take both Rx's Vitamin D 50 mcg daily and in addition to twice a week Vitamin D 1.25 mg.    I also contacted [facility] for them to fill both Vitamin D Rx's they do have it noted by pharmacist for clarification.

## 2022-07-22 NOTE — Telephone Encounter (Signed)
Pt LVM at 4:22p and then [facility] LVM at 4:22p.  Pt is confused about what Helene Kelp wants her to take.  Pharmacy is confused as well.  Pls call pharmacy back at [facility] call pt back at [phone]

## 2022-07-22 NOTE — Telephone Encounter (Signed)
Pt called and said that she needs clarification on the vitamin d scripts. The pharmacy is worreied about such a high dose. Please call [facility] and let her know if she is suppose to take both vitamin d supplements together  or not. Please call her  [phone]

## 2022-07-23 NOTE — Telephone Encounter (Signed)
Called pharmacy and they had already clarified with Traci yesterday.

## 2022-08-29 ENCOUNTER — Other Ambulatory Visit: Payer: Self-pay

## 2022-08-29 MED ORDER — LITHIUM CARBONATE 150 MG PO CAPS: 450.0000 mg | ORAL_CAPSULE | Freq: Every day | ORAL | 1 refills | Status: DC

## 2022-08-29 MED ORDER — MIRTAZAPINE 15 MG PO TABS: ORAL_TABLET | ORAL | 0 refills | Status: DC

## 2022-08-31 ENCOUNTER — Other Ambulatory Visit: Payer: Self-pay | Admitting: Specialist

## 2022-08-31 DIAGNOSIS — H538 Other visual disturbances: Secondary | ICD-10-CM

## 2022-09-19 ENCOUNTER — Ambulatory Visit
Admission: RE | Admit: 2022-09-19 | Discharge: 2022-09-19 | Disposition: A | Discharge status: Home / Self Care | Payer: Medicare Other | Source: Ambulatory Visit | Attending: Specialist | Admitting: Specialist

## 2022-09-19 DIAGNOSIS — H538 Other visual disturbances: Secondary | ICD-10-CM

## 2022-09-22 HISTORY — PX: MRI: SHX5353

## 2022-10-04 ENCOUNTER — Encounter: Payer: Self-pay | Admitting: Neurology

## 2022-10-25 ENCOUNTER — Encounter: Payer: Self-pay | Admitting: Physician Assistant

## 2022-10-25 ENCOUNTER — Telehealth: Payer: Medicare Other | Admitting: Physician Assistant

## 2022-10-25 DIAGNOSIS — G2581 Restless legs syndrome: Secondary | ICD-10-CM

## 2022-10-25 DIAGNOSIS — F3342 Major depressive disorder, recurrent, in full remission: Secondary | ICD-10-CM | POA: Diagnosis not present

## 2022-10-25 DIAGNOSIS — F411 Generalized anxiety disorder: Secondary | ICD-10-CM

## 2022-10-25 DIAGNOSIS — G4700 Insomnia, unspecified: Secondary | ICD-10-CM

## 2022-10-25 MED ORDER — MIRTAZAPINE 45 MG PO TABS: 45.0000 mg | ORAL_TABLET | Freq: Every day | ORAL | 1 refills | Status: DC

## 2022-10-25 MED ORDER — MIRTAZAPINE 15 MG PO TABS: ORAL_TABLET | ORAL | 1 refills | Status: DC

## 2022-10-25 MED ORDER — BUSPIRONE HCL 30 MG PO TABS: ORAL_TABLET | ORAL | 1 refills | Status: DC

## 2022-10-25 MED ORDER — LITHIUM CARBONATE 150 MG PO CAPS: 450.0000 mg | ORAL_CAPSULE | Freq: Every day | ORAL | 1 refills | Status: DC

## 2022-10-25 MED ORDER — HYDROXYZINE HCL 25 MG PO TABS: 25.0000 mg | ORAL_TABLET | Freq: Two times a day (BID) | ORAL | 1 refills | Status: DC

## 2022-10-25 NOTE — Progress Notes (Signed)
[facility]  Patient ID: Natasha Cole,  MRN: [id number]  PCP: Krystal Clark, NP   Date of Evaluation: 10/27/2022 Time spent:30 minutes  Chief Complaint:  anxiety, depression, insomnia  Virtual Visit via Telehealth  I connected with patient by a video enabled telemedicine application with their informed consent, and verified patient privacy and that I am speaking with the correct person using two identifiers.  I am private, in my office and the patient is at home.  I discussed the limitations, risks, security and privacy concerns of performing an evaluation and management service by video and the availability of in person appointments. I also discussed with the patient that there may be a patient responsible charge related to this service. The patient expressed understanding and agreed to proceed.   I discussed the assessment and treatment plan with the patient. The patient was provided an opportunity to ask questions and all were answered. The patient agreed with the plan and demonstrated an understanding of the instructions.   The patient was advised to call back or seek an in-person evaluation if the symptoms worsen or if the condition fails to improve as anticipated.  I provided  30  minutes of non-face-to-face time during this encounter.  HISTORY/CURRENT STATUS: For routine med check.   [name] is doing well. Feels that her psych meds are working well. Patient is able to enjoy things.  Energy and motivation are good.  No extreme sadness, tearfulness, or feelings of hopelessness.   ADLs and personal hygiene are normal.   Denies any changes in concentration, making decisions, or remembering things.  Appetite has not changed.  Weight is stable.  Denies suicidal or homicidal thoughts.  Patient denies increased energy with decreased need for sleep, increased talkativeness, racing thoughts, impulsivity or risky behaviors, increased spending, increased libido,  grandiosity, increased irritability or anger, paranoia, or hallucinations.  Sleeps well most of the time. Anxiety is well-controlled, Buspar and hydroxyzine are effective. No PA.   Denies dizziness, syncope, seizures, numbness, tingling, tremor, tics, unsteady gait, slurred speech, confusion. Denies muscle or joint pain, stiffness, or dystonia. Denies unexplained weight loss, frequent infections, or sores that heal slowly.  No polyphagia, polydipsia, or polyuria. Denies visual changes or paresthesias.   Individual Medical History/ Review of Systems: Changes? :Yes      dry eyes, now on Restasis  Past medications for mental health diagnoses include: Lithium (dose higher than 450 mg daily caused slurred speech) Seroquel, Latuda, Abilify, Remeron, BuSpar, Effexor, Deplin, Rexulti, Risperdal, Cymbalta, Prozac, Pristiq, Lamictal, Wellbutrin, trazodone, Viibryd, Zoloft, Mirapex, Klonopin, imipramine caused rash and itching  Allergies: Imipramine and Nsaids  Current Medications:  Current Outpatient Medications:    amLODipine (NORVASC) 5 MG tablet, Take 5 mg by mouth daily., Disp: , Rfl:    B-Complex CAPS, Take 1 capsule by mouth daily., Disp: 30 capsule, Rfl:    Biotin 16109 MCG TABS, Take 10,000 mcg by mouth daily., Disp: , Rfl:    Cholecalciferol (VITAMIN D) 50 MCG (2000 UT) tablet, Take 1 tablet (2,000 Units total) by mouth daily., Disp: 30 tablet, Rfl: 11   EPINEPHrine 0.3 mg/0.3 mL IJ SOAJ injection, Use as directed for life threatening allergic reactions only, Disp: 2 each, Rfl: 3   ferrous sulfate 325 (65 FE) MG tablet, Take 325 mg by mouth every evening., Disp: , Rfl:    levothyroxine (SYNTHROID, LEVOTHROID) 50 MCG tablet, Take 50 mcg by mouth daily before breakfast., Disp: , Rfl:    medroxyPROGESTERone (PROVERA) 2.5 MG tablet, Take  5 mg by mouth daily., Disp: , Rfl:    metoprolol succinate (TOPROL-XL) 25 MG 24 hr tablet, Take 25 mg by mouth daily., Disp: , Rfl:    omeprazole (PRILOSEC) 20 MG  capsule, Take 20 mg by mouth daily., Disp: , Rfl:    pramipexole (MIRAPEX) 0.125 MG tablet, TAKE 2 TABLETS BY MOUTH TWICE DAILY, Disp: 360 tablet, Rfl: 1   RESTASIS 0.05 % ophthalmic emulsion, 1 drop 2 (two) times daily., Disp: , Rfl:    rosuvastatin (CRESTOR) 10 MG tablet, TAKE 1 TABLET BY MOUTH ONCE DAILY, Disp: , Rfl:    tolterodine (DETROL) 2 MG tablet, Take 2 mg by mouth 2 (two) times daily., Disp: , Rfl:    Vitamin D, Ergocalciferol, (DRISDOL) 1.25 MG (50000 UNIT) CAPS capsule, Take 1 capsule (50,000 Units total) by mouth 2 (two) times a week., Disp: 26 capsule, Rfl: 1   Vitamin E 180 MG (400 UNIT) CAPS, Take 400 Units by mouth daily., Disp: , Rfl:    busPIRone (BUSPAR) 30 MG tablet, TAKE 1 TABLET BY MOUTH TWICE DAILY GENERIC EQUIVALENT FOR BUSPAR, Disp: 180 tablet, Rfl: 1   hydrOXYzine (ATARAX) 25 MG tablet, Take 1 tablet (25 mg total) by mouth 2 (two) times daily., Disp: 180 tablet, Rfl: 1   lithium carbonate 150 MG capsule, Take 3 capsules (450 mg total) by mouth daily., Disp: 270 capsule, Rfl: 1   methocarbamol (ROBAXIN) 500 MG tablet, Take 1 tablet (500 mg total) by mouth every 6 (six) hours as needed for muscle spasms. (Patient not taking: Reported on 12/31/2021), Disp: 30 tablet, Rfl: 1   mirtazapine (REMERON) 15 MG tablet, Take 1 tablet po with 45 mg qhs = 60 mg, Disp: 90 tablet, Rfl: 1   mirtazapine (REMERON) 45 MG tablet, Take 1 tablet (45 mg total) by mouth at bedtime., Disp: 90 tablet, Rfl: 1   oxyCODONE-acetaminophen (PERCOCET) 10-325 MG tablet, Take 1 tablet by mouth every 4 (four) hours as needed for pain. (Patient not taking: Reported on 12/31/2021), Disp: 30 tablet, Rfl: 0   SUMAtriptan (IMITREX) 100 MG tablet, Take by mouth., Disp: , Rfl:   Current Facility-Administered Medications:    omalizumab Geoffry Paradise) injection 300 mg, 300 mg, Subcutaneous, Q28 days, Kozlow, Alvira Philips, MD, 300 mg at 04/17/20 0930 Medication Side Effects: none  Family Medical/ Social History: Changes?  No  MENTAL HEALTH EXAM:  There were no vitals taken for this visit.There is no height or weight on file to calculate BMI.  General Appearance: Casual, Well Groomed, and Obese  Eye Contact:  Good  Speech:  Clear and Coherent  Volume:  Normal  Mood:  Euthymic  Affect:  Congruent  Thought Process:  Goal Directed and Descriptions of Associations: Circumstantial  Orientation:  Full (Time, Place, and Person)  Thought Content: Logical   Suicidal Thoughts:  No  Homicidal Thoughts:  No  Memory:  WNL  Judgement:  Good  Insight:  Good  Psychomotor Activity:  Normal  Concentration:  Concentration: Good and Attention Span: Good  Recall:  Good  Fund of Knowledge: Good  Language: Good  Assets:  Desire for Improvement  ADL's:  Intact  Cognition: WNL  Prognosis:  Good   Labs  06/01/2022 Lithium 0.6  07/23/2022 TSH 2.8 Cr 1.53 (slightly elevated for several years)  All labs followed by PCP  DIAGNOSES:    ICD-10-CM   1. Recurrent major depression in full remission (HCC)  F33.42     2. Generalized anxiety disorder  F41.1  3. Insomnia, unspecified type  G47.00     4. Restless leg syndrome  G25.81      Receivng Psychotherapy: No   RECOMMENDATIONS:  PDMP was reviewed.  Oxycodone and tramadol in the past year. I provided 30 minutes of non-face-to-face time during this encounter, including time spent before and after the visit in records review, medical decision making, counseling pertinent to today's visit, and charting.   She's doing well so no changes are needed. She understands that Cr is slightly elevated but ok to watch. Will have it rechecked at PCP in June. She knows to call me to order it if for some reason she's unable to have it drawn then.   Continue BuSpar 30 mg, 1 p.o. twice daily. Continue hydroxyzine 25 mg, 1 p.o. twice daily as needed. Continue levothyroxine Rx by PCP. Continue lithium 150 mg, 3 p.o. nightly. Continue mirtazapine  60 mg. Continue pramipexole  0.125 mg, 2 p.o. twice daily. Return in 6 months.   Melony Overly, PA-C

## 2022-11-11 ENCOUNTER — Telehealth: Payer: Self-pay | Admitting: Physician Assistant

## 2022-11-11 NOTE — Telephone Encounter (Signed)
Clara at [facility] Lvm @ 11:15a.  She said they have the script for Lithium [place].  There is a [place] law that have to get approval when the manufacturer of the medication changes.  They need someone to call back and give them approval.   Ref # [id number]  Next appt 10/29

## 2022-11-11 NOTE — Telephone Encounter (Addendum)
Called pharmacy. Spoke with someone, would not let me provide reference number. After checking she switched me to pharmacist. Was on hold for 53 minutes and still not able to speak with a pharmacist.

## 2022-11-26 NOTE — Telephone Encounter (Signed)
[facility] LVM @ 1:07p with the same message that they need approval to fill the script from a different manufacturer. Ph:  [phone]  Next appt 10/29

## 2022-11-26 NOTE — Telephone Encounter (Signed)
Spoke with pharmacist  

## 2023-01-02 ENCOUNTER — Other Ambulatory Visit: Payer: Self-pay

## 2023-01-02 MED ORDER — PRAMIPEXOLE DIHYDROCHLORIDE 0.125 MG PO TABS: 0.2500 mg | ORAL_TABLET | Freq: Two times a day (BID) | ORAL | 1 refills | Status: DC

## 2023-02-09 ENCOUNTER — Ambulatory Visit: Payer: Medicare Other | Admitting: Neurology

## 2023-02-16 ENCOUNTER — Other Ambulatory Visit: Payer: Self-pay

## 2023-02-16 MED ORDER — LITHIUM CARBONATE 150 MG PO CAPS: 450.0000 mg | ORAL_CAPSULE | Freq: Every day | ORAL | 1 refills | Status: DC

## 2023-04-13 ENCOUNTER — Other Ambulatory Visit: Payer: Self-pay | Admitting: Physician Assistant

## 2023-04-13 DIAGNOSIS — R7989 Other specified abnormal findings of blood chemistry: Secondary | ICD-10-CM

## 2023-04-18 NOTE — Telephone Encounter (Signed)
Last level WNL. Is she still taking? LVM to [facility].

## 2023-04-26 ENCOUNTER — Encounter: Payer: Self-pay | Admitting: Physician Assistant

## 2023-04-26 ENCOUNTER — Telehealth: Payer: Medicare Other | Admitting: Physician Assistant

## 2023-04-26 DIAGNOSIS — G4700 Insomnia, unspecified: Secondary | ICD-10-CM

## 2023-04-26 DIAGNOSIS — F411 Generalized anxiety disorder: Secondary | ICD-10-CM

## 2023-04-26 DIAGNOSIS — F3342 Major depressive disorder, recurrent, in full remission: Secondary | ICD-10-CM

## 2023-04-26 DIAGNOSIS — E032 Hypothyroidism due to medicaments and other exogenous substances: Secondary | ICD-10-CM

## 2023-04-26 DIAGNOSIS — Z79899 Other long term (current) drug therapy: Secondary | ICD-10-CM

## 2023-04-26 DIAGNOSIS — G2581 Restless legs syndrome: Secondary | ICD-10-CM

## 2023-04-26 DIAGNOSIS — R7989 Other specified abnormal findings of blood chemistry: Secondary | ICD-10-CM

## 2023-04-26 MED ORDER — HYDROXYZINE HCL 25 MG PO TABS: 25.0000 mg | ORAL_TABLET | Freq: Two times a day (BID) | ORAL | 1 refills | Status: DC

## 2023-04-26 MED ORDER — MIRTAZAPINE 15 MG PO TABS: ORAL_TABLET | ORAL | 1 refills | Status: DC

## 2023-04-26 MED ORDER — MIRTAZAPINE 45 MG PO TABS: 45.0000 mg | ORAL_TABLET | Freq: Every day | ORAL | 1 refills | Status: DC

## 2023-04-26 MED ORDER — VITAMIN D (ERGOCALCIFEROL) 1.25 MG (50000 UNIT) PO CAPS: 50000.0000 [IU] | ORAL_CAPSULE | ORAL | 1 refills | Status: AC

## 2023-04-26 MED ORDER — BUSPIRONE HCL 30 MG PO TABS: ORAL_TABLET | ORAL | 1 refills | Status: DC

## 2023-04-26 NOTE — Progress Notes (Signed)
[facility]  Patient ID: Natasha Cole,  MRN: [id number]  PCP: Krystal Clark, NP   Date of Evaluation: 04/26/2023 Time spent: 32 minutes  Chief Complaint:  routine f/u  Virtual Visit via Telehealth  I connected with patient by a video enabled telemedicine application with their informed consent, and verified patient privacy and that I am speaking with the correct person using two identifiers.  I am private, in my office and the patient is at home.  I discussed the limitations, risks, security and privacy concerns of performing an evaluation and management service by video and the availability of in person appointments. I also discussed with the patient that there may be a patient responsible charge related to this service. The patient expressed understanding and agreed to proceed.   I discussed the assessment and treatment plan with the patient. The patient was provided an opportunity to ask questions and all were answered. The patient agreed with the plan and demonstrated an understanding of the instructions.   The patient was advised to call back or seek an in-person evaluation if the symptoms worsen or if the condition fails to improve as anticipated.  I provided  32 minutes of non-face-to-face time during this encounter.  HISTORY/CURRENT STATUS: For routine med check.   Natasha Cole states she is doing well.  She really enjoys being around her grandkids.  Energy and motivation are good when it comes to them.  She does not work outside the home.  No extreme sadness, tearfulness, or feelings of hopelessness.  Sleeps well most of the time. ADLs and personal hygiene are normal.   Denies any changes in concentration, making decisions, or remembering things.  Appetite has not changed.  Weight is stable.  Anxiety is well controlled with the BuSpar.  No panic attacks.  There are times when she gets overwhelmed but it is not severe and she is able to work through it.   Denies suicidal or homicidal thoughts.  Patient denies increased energy with decreased need for sleep, increased talkativeness, racing thoughts, impulsivity or risky behaviors, increased spending, increased libido, grandiosity, increased irritability or anger, paranoia, or hallucinations.  Denies dizziness, syncope, seizures, numbness, tingling, tremor, tics, unsteady gait, slurred speech, confusion. Denies muscle or joint pain, stiffness, or dystonia. Denies unexplained weight loss, frequent infections, or sores that heal slowly.  No polyphagia, polydipsia, or polyuria. Denies visual changes or paresthesias.   Individual Medical History/ Review of Systems: Changes? :No      Past medications for mental health diagnoses include: Lithium (dose higher than 450 mg daily caused slurred speech) Seroquel, Latuda, Abilify, Remeron, BuSpar, Effexor, Deplin, Rexulti, Risperdal, Cymbalta, Prozac, Pristiq, Lamictal, Wellbutrin, trazodone, Viibryd, Zoloft, Mirapex, Klonopin, imipramine caused rash and itching  Allergies: Imipramine and Nsaids  Current Medications:  Current Outpatient Medications:    B-Complex CAPS, Take 1 capsule by mouth daily., Disp: 30 capsule, Rfl:    Biotin 62952 MCG TABS, Take 10,000 mcg by mouth daily., Disp: , Rfl:    Cholecalciferol (VITAMIN D) 50 MCG (2000 UT) tablet, Take 1 tablet (2,000 Units total) by mouth daily., Disp: 30 tablet, Rfl: 11   ferrous sulfate 325 (65 FE) MG tablet, Take 325 mg by mouth every evening., Disp: , Rfl:    levothyroxine (SYNTHROID, LEVOTHROID) 50 MCG tablet, Take 50 mcg by mouth daily before breakfast., Disp: , Rfl:    lithium carbonate 150 MG capsule, Take 3 capsules (450 mg total) by mouth daily., Disp: 270 capsule, Rfl: 1   medroxyPROGESTERone (PROVERA) 2.5  MG tablet, Take 5 mg by mouth daily., Disp: , Rfl:    omeprazole (PRILOSEC) 20 MG capsule, Take 20 mg by mouth daily., Disp: , Rfl:    pramipexole (MIRAPEX) 0.125 MG tablet, Take 2 tablets (0.25  mg total) by mouth 2 (two) times daily., Disp: 360 tablet, Rfl: 1   rosuvastatin (CRESTOR) 10 MG tablet, TAKE 1 TABLET BY MOUTH ONCE DAILY, Disp: , Rfl:    SUMAtriptan (IMITREX) 100 MG tablet, Take by mouth., Disp: , Rfl:    tolterodine (DETROL) 2 MG tablet, Take 2 mg by mouth 2 (two) times daily., Disp: , Rfl:    Vitamin E 180 MG (400 UNIT) CAPS, Take 400 Units by mouth daily., Disp: , Rfl:    XIIDRA 5 % SOLN, Apply to eye., Disp: , Rfl:    amLODipine (NORVASC) 5 MG tablet, Take 5 mg by mouth daily. (Patient not taking: Reported on 04/26/2023), Disp: , Rfl:    busPIRone (BUSPAR) 30 MG tablet, TAKE 1 TABLET BY MOUTH TWICE DAILY GENERIC EQUIVALENT FOR BUSPAR, Disp: 180 tablet, Rfl: 1   EPINEPHrine 0.3 mg/0.3 mL IJ SOAJ injection, Use as directed for life threatening allergic reactions only (Patient not taking: Reported on 04/26/2023), Disp: 2 each, Rfl: 3   hydrOXYzine (ATARAX) 25 MG tablet, Take 1 tablet (25 mg total) by mouth 2 (two) times daily., Disp: 180 tablet, Rfl: 1   methocarbamol (ROBAXIN) 500 MG tablet, Take 1 tablet (500 mg total) by mouth every 6 (six) hours as needed for muscle spasms. (Patient not taking: Reported on 12/31/2021), Disp: 30 tablet, Rfl: 1   metoprolol succinate (TOPROL-XL) 25 MG 24 hr tablet, Take 25 mg by mouth daily. (Patient not taking: Reported on 04/26/2023), Disp: , Rfl:    mirtazapine (REMERON) 15 MG tablet, Take 1 tablet po with 45 mg qhs = 60 mg, Disp: 90 tablet, Rfl: 1   mirtazapine (REMERON) 45 MG tablet, Take 1 tablet (45 mg total) by mouth at bedtime., Disp: 90 tablet, Rfl: 1   oxyCODONE-acetaminophen (PERCOCET) 10-325 MG tablet, Take 1 tablet by mouth every 4 (four) hours as needed for pain. (Patient not taking: Reported on 12/31/2021), Disp: 30 tablet, Rfl: 0   RESTASIS 0.05 % ophthalmic emulsion, 1 drop 2 (two) times daily. (Patient not taking: Reported on 04/26/2023), Disp: , Rfl:    [START ON 04/28/2023] Vitamin D, Ergocalciferol, (DRISDOL) 1.25 MG (50000  UNIT) CAPS capsule, Take 1 capsule (50,000 Units total) by mouth 2 (two) times a week., Disp: 24 capsule, Rfl: 1  Current Facility-Administered Medications:    omalizumab Geoffry Paradise) injection 300 mg, 300 mg, Subcutaneous, Q28 days, Kozlow, Alvira Philips, MD, 300 mg at 04/17/20 0930 Medication Side Effects: none  Family Medical/ Social History: Changes? No  MENTAL HEALTH EXAM:  There were no vitals taken for this visit.There is no height or weight on file to calculate BMI.  General Appearance: Casual, Well Groomed, and Obese  Eye Contact:  Good  Speech:  Clear and Coherent  Volume:  Normal  Mood:  Euthymic  Affect:  Congruent  Thought Process:  Goal Directed and Descriptions of Associations: Circumstantial  Orientation:  Full (Time, Place, and Person)  Thought Content: Logical   Suicidal Thoughts:  No  Homicidal Thoughts:  No  Memory:  WNL  Judgement:  Good  Insight:  Good  Psychomotor Activity:  Normal  Concentration:  Concentration: Good and Attention Span: Good  Recall:  Good  Fund of Knowledge: Good  Language: Good  Assets:  Communication Skills Desire  for Improvement Financial Resources/Insurance Housing Transportation  ADL's:  Intact  Cognition: WNL  Prognosis:  Good   Labs  From this summer are on chart and were reviewed.  06/01/2022 Lithium 0.6   DIAGNOSES:    ICD-10-CM   1. Recurrent major depression in full remission (HCC)  F33.42 Comprehensive metabolic panel    Lithium level    2. Low vitamin D level  R79.89 Vitamin D, Ergocalciferol, (DRISDOL) 1.25 MG (50000 UNIT) CAPS capsule    3. Generalized anxiety disorder  F41.1     4. Insomnia, unspecified type  G47.00     5. Restless leg syndrome  G25.81     6. Encounter for long-term (current) use of medications  Z79.899 TSH    Comprehensive metabolic panel    Lithium level    7. 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  Oxycodone  and tramadol in the past year. I provided 32 minutes of non-face-to-face time during this encounter, including time spent before and after the visit in records review, medical decision making, counseling pertinent to today's visit, and charting.   She is doing well as far as her medications go so no changes will be made.  We discussed the slight elevation of creatinine over the past couple of lab draws.  Make sure she is drinking plenty of water.  She understands.  Continue BuSpar 30 mg, 1 p.o. twice daily. Continue hydroxyzine 25 mg, 1 p.o. twice daily as needed. Continue levothyroxine Rx by PCP. Continue lithium 150 mg, 3 p.o. nightly. Continue mirtazapine  60 mg. Continue pramipexole 0.125 mg, 2 p.o. twice daily. Get labs done sometime in December, fasting. Return in 6 months.  Melony Overly, PA-C

## 2023-05-11 ENCOUNTER — Other Ambulatory Visit: Payer: Self-pay | Admitting: Physician Assistant

## 2023-06-11 LAB — TSH: TSH: 9.39 u[IU]/mL — ABNORMAL HIGH (ref 0.450–4.500)

## 2023-06-11 LAB — COMPREHENSIVE METABOLIC PANEL
ALT: 24 [IU]/L (ref 0–32)
AST: 24 [IU]/L (ref 0–40)
Albumin: 4.1 g/dL (ref 3.8–4.9)
Alkaline Phosphatase: 101 [IU]/L (ref 44–121)
BUN/Creatinine Ratio: 9 (ref 9–23)
BUN: 12 mg/dL (ref 6–24)
Bilirubin Total: 0.5 mg/dL (ref 0.0–1.2)
CO2: 23 mmol/L (ref 20–29)
Calcium: 9.5 mg/dL (ref 8.7–10.2)
Chloride: 104 mmol/L (ref 96–106)
Creatinine, Ser: 1.31 mg/dL — ABNORMAL HIGH (ref 0.57–1.00)
Globulin, Total: 2.3 g/dL (ref 1.5–4.5)
Glucose: 102 mg/dL — ABNORMAL HIGH (ref 70–99)
Potassium: 5 mmol/L (ref 3.5–5.2)
Sodium: 141 mmol/L (ref 134–144)
Total Protein: 6.4 g/dL (ref 6.0–8.5)
eGFR: 49 mL/min/{1.73_m2} — ABNORMAL LOW (ref >59)

## 2023-06-11 LAB — LITHIUM LEVEL: Lithium Lvl: 0.8 mmol/L (ref 0.5–1.2)

## 2023-06-13 NOTE — Progress Notes (Signed)
Please let her know that TSH is too high.  Is her PCP taking care of of the levothyroxine?  I thought so, but if she is not then I need to change the levothyroxine dose.  Please confirm her current dose.  Then she will need a TSH in approximately 6 weeks. CMP is stable her creatinine is barely elevated but better than a year ago so not significant.  Other values were normal. Lithium level was good at 0.8.  Continue same dose.

## 2023-06-18 ENCOUNTER — Other Ambulatory Visit: Payer: Self-pay | Admitting: Physician Assistant

## 2023-06-30 ENCOUNTER — Telehealth: Payer: Self-pay | Admitting: Physician Assistant

## 2023-06-30 MED ORDER — VITAMIN D 50 MCG (2000 UT) PO TABS: 2000.0000 [IU] | ORAL_TABLET | Freq: Every day | ORAL | 11 refills | Status: AC

## 2023-06-30 NOTE — Telephone Encounter (Signed)
 Patient called in with refill request for Vitamin D 2000 units. States pharmacy is having trouble receiving prescription. Ph: [phone] Appt 4/29 Pharmacy [facility] [address] [place]

## 2023-06-30 NOTE — Telephone Encounter (Signed)
 Patient's vitamin D level 02/17/23 was 45.1. You stopped the Drisdol. Should she continue 2000 units?

## 2023-06-30 NOTE — Telephone Encounter (Signed)
 Patient said is usually costs $4.50 using Rx. Will send in.

## 2023-06-30 NOTE — Telephone Encounter (Signed)
 Yes, she needs to continue the 2,000 units daily. Please send in 30 with 11 RF.  It might be cheaper for her to get it OTC but I'm not sure.  Thanks.

## 2023-07-13 ENCOUNTER — Other Ambulatory Visit: Payer: Self-pay | Admitting: Physician Assistant

## 2023-07-20 ENCOUNTER — Other Ambulatory Visit: Payer: Self-pay

## 2023-07-20 MED ORDER — LITHIUM CARBONATE 150 MG PO CAPS: 450.0000 mg | ORAL_CAPSULE | Freq: Every day | ORAL | 0 refills | Status: DC

## 2023-09-16 ENCOUNTER — Encounter: Payer: Self-pay | Admitting: Neurosurgery

## 2023-09-20 ENCOUNTER — Other Ambulatory Visit: Payer: Self-pay | Admitting: Neurosurgery

## 2023-09-20 DIAGNOSIS — M48062 Spinal stenosis, lumbar region with neurogenic claudication: Secondary | ICD-10-CM

## 2023-09-22 ENCOUNTER — Other Ambulatory Visit: Payer: Self-pay | Admitting: Neurosurgery

## 2023-09-22 DIAGNOSIS — M48062 Spinal stenosis, lumbar region with neurogenic claudication: Secondary | ICD-10-CM

## 2023-10-03 ENCOUNTER — Encounter: Payer: Self-pay | Admitting: Neurosurgery

## 2023-10-09 ENCOUNTER — Ambulatory Visit
Admission: RE | Admit: 2023-10-09 | Discharge: 2023-10-09 | Disposition: A | Discharge status: Home / Self Care | Source: Ambulatory Visit | Attending: Neurosurgery | Admitting: Neurosurgery

## 2023-10-09 DIAGNOSIS — M48062 Spinal stenosis, lumbar region with neurogenic claudication: Secondary | ICD-10-CM

## 2023-10-25 ENCOUNTER — Telehealth: Payer: Medicare Other | Admitting: Physician Assistant

## 2023-10-25 ENCOUNTER — Encounter: Payer: Self-pay | Admitting: Physician Assistant

## 2023-10-25 DIAGNOSIS — F411 Generalized anxiety disorder: Secondary | ICD-10-CM

## 2023-10-25 DIAGNOSIS — E032 Hypothyroidism due to medicaments and other exogenous substances: Secondary | ICD-10-CM | POA: Diagnosis not present

## 2023-10-25 DIAGNOSIS — F331 Major depressive disorder, recurrent, moderate: Secondary | ICD-10-CM | POA: Diagnosis not present

## 2023-10-25 DIAGNOSIS — G4700 Insomnia, unspecified: Secondary | ICD-10-CM | POA: Diagnosis not present

## 2023-10-25 DIAGNOSIS — G2581 Restless legs syndrome: Secondary | ICD-10-CM

## 2023-10-25 MED ORDER — ZALEPLON 10 MG PO CAPS
ORAL_CAPSULE | ORAL | 1 refills | Status: DC
Start: 2023-10-25 — End: 2023-12-20

## 2023-10-25 MED ORDER — LITHIUM CARBONATE 150 MG PO CAPS: 450.0000 mg | ORAL_CAPSULE | Freq: Every day | ORAL | 0 refills | Status: DC

## 2023-10-25 MED ORDER — HYDROXYZINE HCL 25 MG PO TABS: 25.0000 mg | ORAL_TABLET | Freq: Two times a day (BID) | ORAL | 1 refills | Status: DC

## 2023-10-25 MED ORDER — LAMOTRIGINE 25 MG PO TABS: ORAL_TABLET | ORAL | 1 refills | Status: DC

## 2023-10-25 MED ORDER — LAMOTRIGINE 100 MG PO TABS: 100.0000 mg | ORAL_TABLET | Freq: Every day | ORAL | 1 refills | Status: DC

## 2023-10-25 MED ORDER — MIRTAZAPINE 15 MG PO TABS: ORAL_TABLET | ORAL | 1 refills | Status: DC

## 2023-10-25 MED ORDER — MIRTAZAPINE 45 MG PO TABS: 45.0000 mg | ORAL_TABLET | Freq: Every day | ORAL | 1 refills | Status: DC

## 2023-10-25 MED ORDER — BUSPIRONE HCL 30 MG PO TABS: ORAL_TABLET | ORAL | 1 refills | Status: DC
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
## 2023-11-08 ENCOUNTER — Telehealth: Payer: Self-pay | Admitting: Physician Assistant

## 2023-11-08 NOTE — Telephone Encounter (Signed)
 Patient is reporting rash on her arms and itching 2 weeks after starting Lamictal . Told her to stop it and she could take an antihistamine for rash/itching.

## 2023-11-08 NOTE — Telephone Encounter (Signed)
 Pt started new med almost 2 weeks ago and called reporting arms broken out in rash and itching from head to toe. RTC [id number]

## 2023-11-09 NOTE — Telephone Encounter (Signed)
 Please check on her, is the rash better?  Have her take Zyrtec 10 mg at bedtime until rash resolves, plus Benadryl 25 mg tid prn rash/itching. If it doesn't improve or gets worse in the next few days, call.

## 2023-11-09 NOTE — Telephone Encounter (Signed)
Patient notified of recommendation.

## 2023-11-09 NOTE — Telephone Encounter (Signed)
 Yes, Sonata  20 mg is ok for a few nights if needed.  Say no more than 4, then go back to 10 mg at bedtime prn and 10 mg during night prn.

## 2023-11-09 NOTE — Telephone Encounter (Signed)
 Pt reporting rash is unchanged, but is not bad. Reviewed recommendations with her.  She is asking if she can take two Sonata  at bedtime instead of taking one at bedtime and then another one when she wakes up. Reporting she is waking up just a few hours after taking just one.

## 2023-11-09 NOTE — Telephone Encounter (Signed)
 LVM to [facility]

## 2023-12-20 ENCOUNTER — Encounter: Payer: Self-pay | Admitting: Physician Assistant

## 2023-12-20 ENCOUNTER — Telehealth: Admitting: Physician Assistant

## 2023-12-20 DIAGNOSIS — F329 Major depressive disorder, single episode, unspecified: Secondary | ICD-10-CM | POA: Diagnosis not present

## 2023-12-20 DIAGNOSIS — G4700 Insomnia, unspecified: Secondary | ICD-10-CM

## 2023-12-20 DIAGNOSIS — F411 Generalized anxiety disorder: Secondary | ICD-10-CM | POA: Diagnosis not present

## 2023-12-20 HISTORY — DX: Insomnia, unspecified: G47.00

## 2023-12-20 MED ORDER — PRAMIPEXOLE DIHYDROCHLORIDE 0.5 MG PO TABS: 0.5000 mg | ORAL_TABLET | Freq: Two times a day (BID) | ORAL | 0 refills | Status: DC

## 2023-12-20 MED ORDER — ZALEPLON 10 MG PO CAPS: ORAL_CAPSULE | ORAL | 5 refills | Status: DC

## 2023-12-20 MED ORDER — ALPRAZOLAM 0.5 MG PO TABS: 0.2500 mg | ORAL_TABLET | Freq: Two times a day (BID) | ORAL | 1 refills | Status: DC | PRN

## 2023-12-20 NOTE — Progress Notes (Signed)
 [facility]  Patient ID: Natasha Cole,  MRN: [id number]  PCP: Benson Eleanor Rung, NP   Date of Evaluation: 12/20/2023 Time spent:35 minutes  Chief Complaint:  routine f/u  Virtual Visit via Telehealth  I connected with patient by a video enabled telemedicine application with their informed consent, and verified patient privacy and that I am speaking with the correct person using two identifiers.  I am private, in my office and the patient is at home.  I discussed the limitations, risks, security and privacy concerns of performing an evaluation and management service by video and the availability of in person appointments. I also discussed with the patient that there may be a patient responsible charge related to this service. The patient expressed understanding and agreed to proceed.   I discussed the assessment and treatment plan with the patient. The patient was provided an opportunity to ask questions and all were answered. The patient agreed with the plan and demonstrated an understanding of the instructions.   The patient was advised to call back or seek an in-person evaluation if the symptoms worsen or if the condition fails to improve as anticipated.  I provided 35 minutes of non-face-to-face time during this encounter.  HISTORY/CURRENT STATUS: For routine med check.   Unfortunately she had a rash with the Lamictal  so it was discontinued.  It is now marked as an allergic reaction in her chart.  Continues to have depression.  Feels down a lot.  Does not want to do much of anything and lacks energy.  She is keeping 4 of her grandkids this summer, ages 20 to 35 years old.  She is able to do that even though it is hard on her energy wise.  Appetite is decreased.  ADLs and personal hygiene are normal.  She feels like crying sometimes but is unable to do so.  She is sleeping better since we added Sonata .  Complains of anxiety, not relieved with hydroxyzine .  She  is taking it twice a day without relief.  Feels overwhelmed at times.  Does not report panic attacks.  No mania, psychosis, delirium, SI/HI.  Denies dizziness, syncope, seizures, numbness, tingling, tremor, tics, unsteady gait, slurred speech, confusion. Denies muscle or joint pain, stiffness, or dystonia.  Individual Medical History/ Review of Systems: Changes? :No      Past medications for mental health diagnoses include: Lithium  (dose higher than 450 mg daily caused slurred speech) Seroquel, Latuda, Abilify, Remeron , BuSpar , Effexor, Deplin, Rexulti, Risperdal, Cymbalta, Prozac, Pristiq, Lamictal , Wellbutrin, trazodone, Viibryd, Zoloft, Mirapex , Klonopin, imipramine  caused rash and itching, Lamictal  caused rash  Allergies: Imipramine , Lamictal  [lamotrigine ], and Nsaids  Current Medications:  Current Outpatient Medications:    ALPRAZolam (XANAX) 0.5 MG tablet, Take 0.5-1.5 tablets (0.25-0.75 mg total) by mouth 2 (two) times daily as needed for anxiety., Disp: 60 tablet, Rfl: 1   Biotin  10000 MCG TABS, Take 10,000 mcg by mouth daily., Disp: , Rfl:    busPIRone  (BUSPAR ) 30 MG tablet, TAKE 1 TABLET BY MOUTH TWICE DAILY GENERIC EQUIVALENT FOR BUSPAR , Disp: 180 tablet, Rfl: 1   Cholecalciferol  (VITAMIN D ) 50 MCG (2000 UT) tablet, Take 1 tablet (2,000 Units total) by mouth daily., Disp: 30 tablet, Rfl: 11   EPINEPHrine  0.3 mg/0.3 mL IJ SOAJ injection, Use as directed for life threatening allergic reactions only, Disp: 2 each, Rfl: 3   ferrous sulfate  325 (65 FE) MG tablet, Take 325 mg by mouth every evening. Every other day, Disp: , Rfl:    levothyroxine  (SYNTHROID ,  LEVOTHROID) 50 MCG tablet, Take 50 mcg by mouth daily before breakfast., Disp: , Rfl:    lithium  carbonate 150 MG capsule, Take 3 capsules (450 mg total) by mouth daily., Disp: 270 capsule, Rfl: 0   medroxyPROGESTERone  (PROVERA ) 2.5 MG tablet, Take 5 mg by mouth daily., Disp: , Rfl:    mirtazapine  (REMERON ) 15 MG tablet, Take 1 tablet  po with 45 mg qhs = 60 mg, Disp: 90 tablet, Rfl: 1   mirtazapine  (REMERON ) 45 MG tablet, Take 1 tablet (45 mg total) by mouth at bedtime., Disp: 90 tablet, Rfl: 1   omeprazole (PRILOSEC) 20 MG capsule, Take 20 mg by mouth daily., Disp: , Rfl:    pramipexole  (MIRAPEX ) 0.5 MG tablet, Take 1 tablet (0.5 mg total) by mouth 2 (two) times daily., Disp: 180 tablet, Rfl: 0   rosuvastatin  (CRESTOR ) 10 MG tablet, TAKE 1 TABLET BY MOUTH ONCE DAILY, Disp: , Rfl:    SUMAtriptan (IMITREX) 100 MG tablet, Take by mouth., Disp: , Rfl:    Vitamin D , Ergocalciferol , (DRISDOL ) 1.25 MG (50000 UNIT) CAPS capsule, Take 1 capsule (50,000 Units total) by mouth 2 (two) times a week. (Patient taking differently: Take 50,000 Units by mouth every 7 (seven) days.), Disp: 24 capsule, Rfl: 1   Vitamin E  180 MG (400 UNIT) CAPS, Take 400 Units by mouth daily., Disp: , Rfl:    amLODipine  (NORVASC ) 5 MG tablet, Take 5 mg by mouth daily. (Patient not taking: Reported on 04/26/2023), Disp: , Rfl:    B-Complex CAPS, Take 1 capsule by mouth daily., Disp: 30 capsule, Rfl:    methocarbamol  (ROBAXIN ) 500 MG tablet, Take 1 tablet (500 mg total) by mouth every 6 (six) hours as needed for muscle spasms. (Patient not taking: Reported on 12/31/2021), Disp: 30 tablet, Rfl: 1   metoprolol  succinate (TOPROL -XL) 25 MG 24 hr tablet, Take 25 mg by mouth daily. (Patient not taking: Reported on 04/26/2023), Disp: , Rfl:    oxyCODONE -acetaminophen  (PERCOCET) 10-325 MG tablet, Take 1 tablet by mouth every 4 (four) hours as needed for pain. (Patient not taking: Reported on 10/25/2023), Disp: 30 tablet, Rfl: 0   RESTASIS 0.05 % ophthalmic emulsion, 1 drop 2 (two) times daily. (Patient not taking: Reported on 10/25/2023), Disp: , Rfl:    tolterodine (DETROL) 2 MG tablet, Take 2 mg by mouth 2 (two) times daily. (Patient not taking: Reported on 10/25/2023), Disp: , Rfl:    XIIDRA 5 % SOLN, Apply to eye. (Patient not taking: Reported on 12/20/2023), Disp: , Rfl:     zaleplon  (SONATA ) 10 MG capsule, 1 p.o. nightly as needed sleep.  May repeat one more for mid nocturnal awakening as long as she has 3 to 4 hours left to sleep., Disp: 60 capsule, Rfl: 5  Current Facility-Administered Medications:    omalizumab  (XOLAIR ) injection 300 mg, 300 mg, Subcutaneous, Q28 days, Kozlow, Eric J, MD, 300 mg at 04/17/20 0930 Medication Side Effects: none  Family Medical/ Social History: Changes? No  MENTAL HEALTH EXAM:  There were no vitals taken for this visit.There is no height or weight on file to calculate BMI.  General Appearance: Casual, Well Groomed, and Obese  Eye Contact:  Good  Speech:  Clear and Coherent  Volume:  Normal  Mood:  Sad  Affect:  Depressed  Thought Process:  Goal Directed and Descriptions of Associations: Circumstantial  Orientation:  Full (Time, Place, and Person)  Thought Content: Logical   Suicidal Thoughts:  No  Homicidal Thoughts:  No  Memory:  WNL  Judgement:  Good  Insight:  Good  Psychomotor Activity:  Normal  Concentration:  Concentration: Good and Attention Span: Good  Recall:  Good  Fund of Knowledge: Good  Language: Good  Assets:  Communication Skills Desire for Improvement Financial Resources/Insurance Housing Transportation  ADL's:  Intact  Cognition: WNL  Prognosis:  Good   Pertinent labs  12/09/2023 CBC normal CMP normal except creatinine 1.3, GFR 49 Hemoglobin A1c 5.9 Magnesium normal Vitamin B12 normal TSH 6.4 Total cholesterol 226 otherwise normal  Last lithium  level was 06/10/2023, 0.8  DIAGNOSES:    ICD-10-CM   1. Generalized anxiety disorder  F41.1     2. 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  Sonata  filled 11/22/2023. I provided 35 minutes of non-face-to-face time during this encounter, including time spent before and after the visit in records review, medical decision making,  counseling pertinent to today's visit, and charting.   We discussed the treatment resistant depression.  Neither of us  want to add another medication if we do not have to.  There is evidence now that pramipexole  is helpful for depression.  Since she is already on that I recommend increasing the dose.  Pros and cons were discussed and she accepts.  The anxiety is not well-controlled with the hydroxyzine .  I recommend adding Xanax.  She has taken Klonopin in the past and it was not effective. We discussed the short-term and long-term risks of benzodiazepines including sedation, increased risk of falling, dizziness, tolerance, and addictive potential.  Patient understands and accepts.   Discontinue hydroxyzine .  Start Xanax 0.5 mg, 1/2 to 1-1/2 pills twice daily as needed anxiety. Continue BuSpar  30 mg, 1 p.o. twice daily. Continue levothyroxine  Rx by PCP. Continue lithium  150 mg, 3 p.o. nightly. Continue mirtazapine   60 mg. Increase pramipexole  to 0.5 mg, 1 p.o. twice daily.  Continue Sonata  10 mg, 1 p.o. nightly as needed sleep may repeat 1 for mid nocturnal awakening as long as she has 3 to 4 hours left to sleep. Return in 6-8 weeks.  Verneita Cooks, PA-C

## 2024-01-04 ENCOUNTER — Other Ambulatory Visit: Payer: Self-pay | Admitting: Physician Assistant

## 2024-01-10 ENCOUNTER — Other Ambulatory Visit: Payer: Self-pay | Admitting: Physician Assistant

## 2024-01-17 ENCOUNTER — Other Ambulatory Visit: Payer: Self-pay | Admitting: Pain Medicine

## 2024-01-20 ENCOUNTER — Encounter (HOSPITAL_COMMUNITY): Payer: Self-pay

## 2024-01-20 NOTE — Pre-Procedure Instructions (Signed)
 Surgical Instructions   Your procedure is scheduled on January 25, 2024. Report to [facility] Main Entrance A at 1:00 P.M., then check in with the Admitting office. Any questions or running late day of surgery: call [phone]  Questions prior to your surgery date: call [phone], Monday-Friday, 8am-4pm. If you experience any cold or flu symptoms such as cough, fever, chills, shortness of breath, etc. between now and your scheduled surgery, please notify us  at the above number.     Remember:  Do not eat or drink after midnight the night before your surgery    Take these medicines the morning of surgery with A SIP OF WATER: busPIRone  (BUSPAR )  levothyroxine  (SYNTHROID )  lithium  carbonate  metoprolol  succinate (TOPROL -XL)  pramipexole  (MIRAPEX )  rosuvastatin  (CRESTOR )    May take these medicines IF NEEDED: ALPRAZolam  (XANAX )  EPINEPHrine  Pen SUMAtriptan (IMITREX)    One week prior to surgery, STOP taking any Aspirin (unless otherwise instructed by your surgeon) Aleve, Naproxen, Ibuprofen, Motrin, Advil, Goody's, BC's, all herbal medications, fish oil, and non-prescription vitamins.                     Do NOT Smoke (Tobacco/Vaping) for 24 hours prior to your procedure.  If you use a CPAP at night, you may bring your mask/headgear for your overnight stay.   You will be asked to remove any contacts, glasses, piercing's, hearing aid's, dentures/partials prior to surgery. Please bring cases for these items if needed.    Patients discharged the day of surgery will not be allowed to drive home, and someone needs to stay with them for 24 hours.  SURGICAL WAITING ROOM VISITATION Patients may have no more than 2 support people in the waiting area - these visitors may rotate.   Pre-op nurse will coordinate an appropriate time for 1 ADULT support person, who may not rotate, to accompany patient in pre-op.  Children under the age of 46 must have an adult with them who is not the  patient and must remain in the main waiting area with an adult.  If the patient needs to stay at the hospital during part of their recovery, the visitor guidelines for inpatient rooms apply.  Please refer to the [facility] website for the visitor guidelines for any additional information.   If you received a COVID test during your pre-op visit  it is requested that you wear a mask when out in public, stay away from anyone that may not be feeling well and notify your surgeon if you develop symptoms. If you have been in contact with anyone that has tested positive in the last 10 days please notify you surgeon.      Pre-operative 5 CHG Bathing Instructions   You can play a key role in reducing the risk of infection after surgery. Your skin needs to be as free of germs as possible. You can reduce the number of germs on your skin by washing with CHG (chlorhexidine  gluconate) soap before surgery. CHG is an antiseptic soap that kills germs and continues to kill germs even after washing.   DO NOT use if you have an allergy  to chlorhexidine /CHG or antibacterial soaps. If your skin becomes reddened or irritated, stop using the CHG and notify one of our RNs at [phone].   Please shower with the CHG soap starting 4 days before surgery using the following schedule:     Please keep in mind the following:  DO NOT shave, including legs and underarms, starting  the day of your first shower.   You may shave your face at any point before/day of surgery.  Place clean sheets on your bed the day you start using CHG soap. Use a clean washcloth (not used since being washed) for each shower. DO NOT sleep with pets once you start using the CHG.   CHG Shower Instructions:  Wash your face and private area with normal soap. If you choose to wash your hair, wash first with your normal shampoo.  After you use shampoo/soap, rinse your hair and body thoroughly to remove shampoo/soap residue.  Turn the water OFF  and apply about 3 tablespoons (45 ml) of CHG soap to a CLEAN washcloth.  Apply CHG soap ONLY FROM YOUR NECK DOWN TO YOUR TOES (washing for 3-5 minutes)  DO NOT use CHG soap on face, private areas, open wounds, or sores.  Pay special attention to the area where your surgery is being performed.  If you are having back surgery, having someone wash your back for you may be helpful. Wait 2 minutes after CHG soap is applied, then you may rinse off the CHG soap.  Pat dry with a clean towel  Put on clean clothes/pajamas   If you choose to wear lotion, please use ONLY the CHG-compatible lotions that are listed below.  Additional instructions for the day of surgery: DO NOT APPLY any lotions, deodorants, cologne, or perfumes.   Do not bring valuables to the hospital. [facility] is not responsible for any belongings/valuables. Do not wear nail polish, gel polish, artificial nails, or any other type of covering on natural nails (fingers and toes) Do not wear jewelry or makeup Put on clean/comfortable clothes.  Please brush your teeth.  Ask your nurse before applying any prescription medications to the skin.     CHG Compatible Lotions   Aveeno Moisturizing lotion  Cetaphil Moisturizing Cream  Cetaphil Moisturizing Lotion  Clairol Herbal Essence Moisturizing Lotion, Dry Skin  Clairol Herbal Essence Moisturizing Lotion, Extra Dry Skin  Clairol Herbal Essence Moisturizing Lotion, Normal Skin  Curel Age Defying Therapeutic Moisturizing Lotion with Alpha Hydroxy  Curel Extreme Care Body Lotion  Curel Soothing Hands Moisturizing Hand Lotion  Curel Therapeutic Moisturizing Cream, Fragrance-Free  Curel Therapeutic Moisturizing Lotion, Fragrance-Free  Curel Therapeutic Moisturizing Lotion, Original Formula  Eucerin Daily Replenishing Lotion  Eucerin Dry Skin Therapy Plus Alpha Hydroxy Crme  Eucerin Dry Skin Therapy Plus Alpha Hydroxy Lotion  Eucerin Original Crme  Eucerin Original Lotion   Eucerin Plus Crme Eucerin Plus Lotion  Eucerin TriLipid Replenishing Lotion  Keri Anti-Bacterial Hand Lotion  Keri Deep Conditioning Original Lotion Dry Skin Formula Softly Scented  Keri Deep Conditioning Original Lotion, Fragrance Free Sensitive Skin Formula  Keri Lotion Fast Absorbing Fragrance Free Sensitive Skin Formula  Keri Lotion Fast Absorbing Softly Scented Dry Skin Formula  Keri Original Lotion  Keri Skin Renewal Lotion Keri Silky Smooth Lotion  Keri Silky Smooth Sensitive Skin Lotion  Nivea Body Creamy Conditioning Oil  Nivea Body Extra Enriched Lotion  Nivea Body Original Lotion  Nivea Body Sheer Moisturizing Lotion Nivea Crme  Nivea Skin Firming Lotion  NutraDerm 30 Skin Lotion  NutraDerm Skin Lotion  NutraDerm Therapeutic Skin Cream  NutraDerm Therapeutic Skin Lotion  ProShield Protective Hand Cream  Provon moisturizing lotion  Please read over the following fact sheets that you were given.

## 2024-01-23 ENCOUNTER — Encounter (HOSPITAL_COMMUNITY)
Admission: RE | Admit: 2024-01-23 | Discharge: 2024-01-23 | Disposition: A | Discharge status: Home / Self Care | Source: Ambulatory Visit | Attending: Pain Medicine | Admitting: Pain Medicine

## 2024-01-23 ENCOUNTER — Telehealth: Payer: Self-pay | Admitting: Physician Assistant

## 2024-01-23 ENCOUNTER — Other Ambulatory Visit: Payer: Self-pay

## 2024-01-23 ENCOUNTER — Encounter (HOSPITAL_COMMUNITY): Payer: Self-pay

## 2024-01-23 VITALS — BP 173/78 | HR 69 | Temp 97.9°F | Resp 18 | Ht 63.0 in | Wt 355.0 lb

## 2024-01-23 DIAGNOSIS — E039 Hypothyroidism, unspecified: Secondary | ICD-10-CM | POA: Insufficient documentation

## 2024-01-23 DIAGNOSIS — N183 Chronic kidney disease, stage 3 unspecified: Secondary | ICD-10-CM | POA: Diagnosis not present

## 2024-01-23 DIAGNOSIS — Z981 Arthrodesis status: Secondary | ICD-10-CM | POA: Diagnosis not present

## 2024-01-23 DIAGNOSIS — I493 Ventricular premature depolarization: Secondary | ICD-10-CM | POA: Diagnosis not present

## 2024-01-23 DIAGNOSIS — E785 Hyperlipidemia, unspecified: Secondary | ICD-10-CM | POA: Insufficient documentation

## 2024-01-23 DIAGNOSIS — F332 Major depressive disorder, recurrent severe without psychotic features: Secondary | ICD-10-CM | POA: Diagnosis not present

## 2024-01-23 DIAGNOSIS — R7303 Prediabetes: Secondary | ICD-10-CM | POA: Diagnosis not present

## 2024-01-23 DIAGNOSIS — D649 Anemia, unspecified: Secondary | ICD-10-CM | POA: Insufficient documentation

## 2024-01-23 DIAGNOSIS — I491 Atrial premature depolarization: Secondary | ICD-10-CM | POA: Diagnosis not present

## 2024-01-23 DIAGNOSIS — Z01818 Encounter for other preprocedural examination: Secondary | ICD-10-CM | POA: Insufficient documentation

## 2024-01-23 DIAGNOSIS — I472 Ventricular tachycardia, unspecified: Secondary | ICD-10-CM | POA: Diagnosis not present

## 2024-01-23 DIAGNOSIS — I129 Hypertensive chronic kidney disease with stage 1 through stage 4 chronic kidney disease, or unspecified chronic kidney disease: Secondary | ICD-10-CM | POA: Insufficient documentation

## 2024-01-23 HISTORY — DX: Cardiac arrhythmia, unspecified: I49.9

## 2024-01-23 HISTORY — DX: Essential (primary) hypertension: I10

## 2024-01-23 LAB — CBC
HCT: 46.4 % — ABNORMAL HIGH (ref 36.0–46.0)
Hemoglobin: 14.3 g/dL (ref 12.0–15.0)
MCH: 29.1 pg (ref 26.0–34.0)
MCHC: 30.8 g/dL (ref 30.0–36.0)
MCV: 94.3 fL (ref 80.0–100.0)
Platelets: 307 K/uL (ref 150–400)
RBC: 4.92 MIL/uL (ref 3.87–5.11)
RDW: 15.6 % — ABNORMAL HIGH (ref 11.5–15.5)
WBC: 9.3 K/uL (ref 4.0–10.5)
nRBC: 0 % (ref 0.0–0.2)

## 2024-01-23 LAB — SURGICAL PCR SCREEN
MRSA, PCR: NEGATIVE
Staphylococcus aureus: NEGATIVE

## 2024-01-23 LAB — BASIC METABOLIC PANEL WITH GFR
Anion gap: 7 (ref 5–15)
BUN: 9 mg/dL (ref 6–20)
CO2: 27 mmol/L (ref 22–32)
Calcium: 9.8 mg/dL (ref 8.9–10.3)
Chloride: 107 mmol/L (ref 98–111)
Creatinine, Ser: 1.41 mg/dL — ABNORMAL HIGH (ref 0.44–1.00)
GFR, Estimated: 44 mL/min — ABNORMAL LOW (ref >60)
Glucose, Bld: 108 mg/dL — ABNORMAL HIGH (ref 70–99)
Potassium: 4.7 mmol/L (ref 3.5–5.1)
Sodium: 141 mmol/L (ref 135–145)

## 2024-01-23 NOTE — Telephone Encounter (Signed)
 Natasha Cole called stating her medications are still not working and her depression is worse. Her phone number is [phone]. Could someone please call her back.

## 2024-01-23 NOTE — Progress Notes (Signed)
 PCP - Eleanor Daring Cardiologist - Leotis Sella, NP    -LOV - 06/10/23 was was supposed to follow up in 7 months.  Patient states that next follow up appointment is in September  PPM/ICD - denies Device Orders - n/a Rep Notified -  n/a  Chest x-ray -  EKG - 01/23/24 Stress Test - 07/28/21 ECHO - 06/04/21 Cardiac Cath - 08/12/21  Sleep Study - patient states that she no longer has sleep apnea after recent sleep study CPAP - n/a  Pre-diabetes - does not check blood sugar at home  Last dose of GLP1 agonist-  n/a GLP1 instructions:  n/a  Blood Thinner Instructions: n/a Aspirin Instructions: n/a  ERAS Protcol - NPO PRE-SURGERY Ensure or G2- n/a  COVID TEST- n/a   Anesthesia review: yes  Patient denies shortness of breath, fever, cough and chest pain at PAT appointment   All instructions explained to the patient, with a verbal understanding of the material. Patient agrees to go over the instructions while at home for a better understanding. Patient also instructed to self quarantine after being tested for COVID-19. The opportunity to ask questions was provided.

## 2024-01-24 NOTE — Progress Notes (Signed)
 Anesthesia Chart Review:  Case: [id number] Date/Time: 01/25/24 1445   Procedure: INSERTION, SPINAL CORD STIMULATOR, LUMBAR - SCS PER W/NEVRO   Anesthesia type: General   Diagnosis: History of lumbosacral spine surgery [Z98.890]   Pre-op diagnosis: HISTORY OF LUMBOSACRAL SPINE SURGERY   [facility] ROOM [facility] / [facility]   Surgeons: Darlis Deatrice RAMAN, MD       DISCUSSION: Patient is a 53 year old female scheduled for the above procedure.  History includes never smoker, HTN, dysrhythmia (PVCs, NSVT), hypothyroidism, CKD (stage 3), GERD, anemia, prediabetes, OSA (does not use CPAP; reported recent f/u sleep study did not show OSA, report not currently available), MDD (s/p ECT 2017), panic attacks, spinal surgery (L4-5 PLIF 09/28/21), gastric bypass (2012, reversal 2018).    Patient follows with cardiology at [facility] for HLD, NSVT (two 4 beat runs on monitor 05/2021), PVCs (22% PVC burden by monitor 05/2021, asymptomatic on metoprolol ). Echo 05/2021 showed normal LV systolic function, no significant valvular abnormalities.  She had an abnormal nuclear stress 07/28/2021 that was followed up by cardiac catheterization 08/12/2021 which showed normal coronaries.  Stress test was deemed to be false positive.  April 2024 Holter monitor showed predominant sinus rhythm, occasional PAC, rare PVCs, 2 brief AT run lasting up to 12 beats. She was last seen 06/10/23 by Tye Leach, NP. Overall doing well from a cardiac standpoint at that time. PVC burden had improved on metoprolol  and no changes made. Next follow-up is scheduled for September.   Anesthesia team to evaluate on the day of surgery.   VS: BP (!) 173/78   Pulse 69   Temp 36.6 C   Resp 18   Ht 5' 3 (1.6 m)   Wt (!) 161 kg   SpO2 100%   BMI 62.89 kg/m    PROVIDERS: Brown-Patram, Eleanor Rung, NP is PCP  Tye Leach, NP is cardiology provider   LABS: Labs reviewed: Acceptable for surgery. Cr 1.41, appears consistent with  previous results.  (all labs ordered are listed, but only abnormal results are displayed)  Labs Reviewed  CBC - Abnormal; Notable for the following components:      Result Value   HCT 46.4 (*)    RDW 15.6 (*)    All other components within normal limits  BASIC METABOLIC PANEL WITH GFR - Abnormal; Notable for the following components:   Glucose, Bld 108 (*)    Creatinine, Ser 1.41 (*)    GFR, Estimated 44 (*)    All other components within normal limits  SURGICAL PCR SCREEN     IMAGES: MRI L spine 10/09/23: IMPRESSION: 1. Previous posterior decompression, diskectomy and fusion procedure at L4-5. Normal alignment with wide patency of the canal and foramina. 2. Mild facet arthritis at L3-4 and L5-S1. No stenosis. 3. Chronic presumably postsurgical fluid collection in the subcutaneous fat in the midline behind L3 and L4 measuring approximately 6 x 4.5 x 6.5 cm. This appears mature with a circumferential capsule. The adjacent fat is unremarkable. No sign that this represents an ongoing pseudomeningocele.   MRI T-spine 10/09/23: IMPRESSION: Normal MRI of the thoracic spine. No finding that would appear to present a complication with respect to neurostimulator placement.   EKG: 01/23/24: Sinus rhythm with Premature atrial complexes Otherwise normal ECG No previous ECGs available Confirmed by Claudene Pacific [phone]) on 01/23/2024 6:27:47 PM  CV: Holter Monitor 3-7 days 10/20/2022 ([facility]): FINAL PROVIDER INTERPRETATION  Agree with Findings.  Occasional PAC s  Rare PVC s  2  Atrial Tachycardia runs occurred, the run with the fastest interval lasting 12 beats with a max rate of 145 bpm  avg 133 bpm ; the  run with the fastest interval was also the longest   PRELIMINARY FINDINGS  Patient had a min HR of 51 bpm, max HR of 145 bpm, and avg HR of 73 bpm. Predominant underlying rhythm was Sinus Rhythm. 2 Supraventricular Tachycardia runs occurred, the run with the fastest interval  lasting 12 beats with a max rate of 145 bpm  avg 133 bpm ; the run with the fastest interval was also the longest. Isolated SVEs were occasional  4.4%, 30995 , SVE Couplets were rare  <1.0%, 128 , and SVE Triplets were rare  <1.0%, 38 . Isolated VEs were rare  <1.0% , and no VE Couplets or VE Triplets were present.    Cath 08/12/2021 (Care Everywhere): CONCLUSIONS:    1.  Normal coronary arteries.  2.  Left ventriculography not done to conserve contrast.  Also she began  to have right radial artery spasm and I did not want to manipulate  catheter into ventricle as it would aggravate the spasm.  3. RRA access.  Some radial artery spasm, treated with verapamil.      TTE 06/04/2021 ([facility]): SUMMARY  The left ventricular size is normal.  Mild left ventricular hypertrophy  Left ventricular systolic function is normal.  LV ejection fraction = 60-65%.  The right ventricle is normal in size and function.  There was insufficient TR detected to calculate RV systolic pressure.  The IVC is normal in size with an inspiratory collapse of greater then  50%, suggesting normal right atrial pressure.  There is no significant valvular stenosis or regurgitation.  There is no pericardial effusion.  There is no comparison study available.         Past Medical History:  Diagnosis Date   Anemia    iron-deficiency anemia   Arthritis    back and hips   Chronic kidney disease    stage 3   DDD (degenerative disc disease), lumbar    Depression    Dysrhythmia    PVCs - on Metoprolol    GERD (gastroesophageal reflux disease)    History of kidney stones    pt states she currently has a kidney stone in right kidney 09-24-21   Hypertension    Hypothyroidism    Nonsustained ventricular tachycardia (HCC) 06/25/2021   pt had two 4-beat runs   Panic attacks    Pre-diabetes    PVC (premature ventricular contraction)    Sleep apnea    per patient, sleep study in 2025 states she no longer has sleep apnea     Past Surgical History:  Procedure Laterality Date   ANKLE SURGERY Right    CARDIAC CATHETERIZATION  08/12/2021   CHOLECYSTECTOMY     gastic bypass  2012   POSTERIOR LUMBAR FUSION  09/27/2021   reversal gastric bypass  2018   TUBAL LIGATION      MEDICATIONS:  ALPRAZolam  (XANAX ) 0.5 MG tablet   B-Complex CAPS   Biotin  10000 MCG TABS   busPIRone  (BUSPAR ) 30 MG tablet   Cholecalciferol  (VITAMIN D ) 50 MCG (2000 UT) tablet   EPINEPHrine  0.3 mg/0.3 mL IJ SOAJ injection   ferrous sulfate  325 (65 FE) MG tablet   levothyroxine  (SYNTHROID ) 75 MCG tablet   lithium  carbonate 150 MG capsule   methocarbamol  (ROBAXIN ) 500 MG tablet   metoprolol  succinate (TOPROL -XL) 25 MG 24 hr tablet  mirtazapine  (REMERON ) 15 MG tablet   mirtazapine  (REMERON ) 45 MG tablet   oxyCODONE -acetaminophen  (PERCOCET) 10-325 MG tablet   pramipexole  (MIRAPEX ) 0.125 MG tablet   pramipexole  (MIRAPEX ) 0.5 MG tablet   rosuvastatin  (CRESTOR ) 10 MG tablet   SUMAtriptan (IMITREX) 100 MG tablet   Vitamin D , Ergocalciferol , (DRISDOL ) 1.25 MG (50000 UNIT) CAPS capsule   Vitamin E  180 MG (400 UNIT) CAPS   zaleplon  (SONATA ) 10 MG capsule    omalizumab  (XOLAIR ) injection 300 mg    Lemar Bakos, PA-C Surgical Short Stay/Anesthesiology [facility] Phone [phone] [facility] Phone [phone] 01/24/2024 10:36 AM

## 2024-01-24 NOTE — Telephone Encounter (Signed)
 Please schedule FU for 6-8 weeks per Verneita.

## 2024-01-24 NOTE — Telephone Encounter (Signed)
 Rx pended for pramipexole  1 mg BID. Asked schedule to schedule appt 6-8 weeks out. She currently has Rx for 0.5 mg BID. Told her she could double up on current Rx, but that new Rx would be for 1 mg BID.

## 2024-01-24 NOTE — Telephone Encounter (Signed)
 Increase pramipexole  to 1 mg, bid.  Please pend for me. #60, RF 1.  Pt needs appt in 6-8 weeks.

## 2024-01-24 NOTE — Anesthesia Preprocedure Evaluation (Signed)
 Anesthesia Evaluation  Patient identified by MRN, date of birth, ID band Patient awake    Reviewed: Allergy  & Precautions, NPO status , Patient's Chart, lab work & pertinent test results  History of Anesthesia Complications Negative for: history of anesthetic complications  Airway Mallampati: III  TM Distance: >3 FB Neck ROM: Full    Dental  (+) Dental Advisory Given   Pulmonary sleep apnea (claims recent sleep study said she longer has OSA)    Pulmonary exam normal        Cardiovascular hypertension, Pt. on home beta blockers and Pt. on medications Normal cardiovascular exam+ dysrhythmias Ventricular Tachycardia      Neuro/Psych  PSYCHIATRIC DISORDERS Anxiety Depression    negative neurological ROS     GI/Hepatic Neg liver ROS,GERD  ,, S/p gastric bypass    Endo/Other  Hypothyroidism  Class 4 obesity Pre-DM   Renal/GU CRFRenal disease     Musculoskeletal  (+) Arthritis ,    Abdominal  (+) + obese  Peds  Hematology negative hematology ROS (+)   Anesthesia Other Findings   Reproductive/Obstetrics                              Anesthesia Physical Anesthesia Plan  ASA: 4  Anesthesia Plan: General   Post-op Pain Management: Tylenol  PO (pre-op)*   Induction: Intravenous  PONV Risk Score and Plan: 3 and Treatment may vary due to age or medical condition, Ondansetron , Dexamethasone  and Midazolam   Airway Management Planned: Oral ETT and Video Laryngoscope Planned  Additional Equipment: None  Intra-op Plan:   Post-operative Plan: Extubation in OR  Informed Consent: I have reviewed the patients History and Physical, chart, labs and discussed the procedure including the risks, benefits and alternatives for the proposed anesthesia with the patient or authorized representative who has indicated his/her understanding and acceptance.     Dental advisory given  Plan Discussed with:  CRNA and Anesthesiologist  Anesthesia Plan Comments: (PAT note written 01/24/2024 by Claborn Janusz, PA-C.)         Anesthesia Quick Evaluation

## 2024-01-24 NOTE — Telephone Encounter (Signed)
 Pt c/o worsening depression and anxiety. Rates depression as 6/10 and anxiety 8/10. I could hear the anxiety in her voice. Not tearful. She said there are situational stressors but didn't provide details. She said was at the tail end of it, but not quite there. Sleeping too much as a form of escape, but also takes Sonata . ADLs are a struggle, but she is able to do them. No SI. Anxiety is all day, every day, and starts as soon as she gets up.  Last seen 6/24 and reports taking all meds as prescribed. Was scheduled 8/19, but appt was canceled and another not scheduled.   Discontinue hydroxyzine .   Start Xanax  0.5 mg, 1/2 to 1-1/2 pills twice daily as needed anxiety. Continue BuSpar  30 mg, 1 p.o. twice daily. Continue levothyroxine  Rx by PCP. Continue lithium  150 mg, 3 p.o. nightly. Continue mirtazapine   60 mg. Increase pramipexole  to 0.5 mg, 1 p.o. twice daily.  Continue Sonata  10 mg, 1 p.o. nightly as needed sleep may repeat 1 for mid nocturnal awakening as long as she has 3 to 4 hours left to sleep. Return in 6-8 weeks  Pharmacy is [facility]

## 2024-01-25 ENCOUNTER — Encounter (HOSPITAL_COMMUNITY): Payer: Self-pay | Admitting: Pain Medicine

## 2024-01-25 ENCOUNTER — Other Ambulatory Visit: Payer: Self-pay

## 2024-01-25 ENCOUNTER — Ambulatory Visit (HOSPITAL_COMMUNITY)

## 2024-01-25 ENCOUNTER — Ambulatory Visit (HOSPITAL_COMMUNITY): Payer: Self-pay | Admitting: Anesthesiology

## 2024-01-25 ENCOUNTER — Encounter: Payer: Self-pay | Admitting: Pain Medicine

## 2024-01-25 ENCOUNTER — Ambulatory Visit: Payer: Self-pay | Admitting: Pain Medicine

## 2024-01-25 ENCOUNTER — Encounter (HOSPITAL_COMMUNITY)
Admission: RE | Disposition: A | Discharge status: Home / Self Care | Payer: Self-pay | Source: Home / Self Care | Attending: Pain Medicine

## 2024-01-25 ENCOUNTER — Ambulatory Visit (HOSPITAL_COMMUNITY)
Admission: RE | Admit: 2024-01-25 | Discharge: 2024-01-25 | Disposition: A | Discharge status: Home / Self Care | Attending: Pain Medicine | Admitting: Pain Medicine

## 2024-01-25 ENCOUNTER — Ambulatory Visit (HOSPITAL_COMMUNITY): Payer: Self-pay | Admitting: Vascular Surgery

## 2024-01-25 DIAGNOSIS — Z9889 Other specified postprocedural states: Secondary | ICD-10-CM | POA: Diagnosis not present

## 2024-01-25 DIAGNOSIS — I129 Hypertensive chronic kidney disease with stage 1 through stage 4 chronic kidney disease, or unspecified chronic kidney disease: Secondary | ICD-10-CM

## 2024-01-25 DIAGNOSIS — N189 Chronic kidney disease, unspecified: Secondary | ICD-10-CM

## 2024-01-25 DIAGNOSIS — E039 Hypothyroidism, unspecified: Secondary | ICD-10-CM

## 2024-01-25 DIAGNOSIS — M961 Postlaminectomy syndrome, not elsewhere classified: Secondary | ICD-10-CM | POA: Insufficient documentation

## 2024-01-25 HISTORY — PX: SPINAL CORD STIMULATOR INSERTION: SHX5378

## 2024-01-25 SURGERY — INSERTION, SPINAL CORD STIMULATOR, LUMBAR
Anesthesia: General

## 2024-01-25 MED ORDER — OXYCODONE HCL 5 MG/5ML PO SOLN: 5.0000 mg | Freq: Once | ORAL | Status: AC | PRN

## 2024-01-25 MED ORDER — CHLORHEXIDINE GLUCONATE CLOTH 2 % EX PADS: 6.0000 | MEDICATED_PAD | Freq: Once | CUTANEOUS | Status: DC

## 2024-01-25 MED ORDER — LIDOCAINE 2% (20 MG/ML) 5 ML SYRINGE
INTRAMUSCULAR | Status: AC
  Filled 2024-01-25: qty 5

## 2024-01-25 MED ORDER — PROPOFOL 10 MG/ML IV BOLUS
INTRAVENOUS | Status: AC
  Filled 2024-01-25: qty 20

## 2024-01-25 MED ORDER — DEXAMETHASONE SODIUM PHOSPHATE 10 MG/ML IJ SOLN
INTRAMUSCULAR | Status: AC
  Filled 2024-01-25: qty 1

## 2024-01-25 MED ORDER — FENTANYL CITRATE (PF) 250 MCG/5ML IJ SOLN
INTRAMUSCULAR | Status: AC
  Filled 2024-01-25: qty 5

## 2024-01-25 MED ORDER — ORAL CARE MOUTH RINSE: 15.0000 mL | Freq: Once | OROMUCOSAL | Status: AC

## 2024-01-25 MED ORDER — PROPOFOL 10 MG/ML IV BOLUS
INTRAVENOUS | Status: DC | PRN
  Administered 2024-01-25: 200 mg via INTRAVENOUS

## 2024-01-25 MED ORDER — AMISULPRIDE (ANTIEMETIC) 5 MG/2ML IV SOLN
INTRAVENOUS | Status: AC
  Filled 2024-01-25: qty 4

## 2024-01-25 MED ORDER — FENTANYL CITRATE (PF) 100 MCG/2ML IJ SOLN
25.0000 ug | INTRAMUSCULAR | Status: DC | PRN
  Administered 2024-01-25: 25 ug via INTRAVENOUS
  Administered 2024-01-25: 50 ug via INTRAVENOUS
  Administered 2024-01-25: 25 ug via INTRAVENOUS
  Administered 2024-01-25: 50 ug via INTRAVENOUS

## 2024-01-25 MED ORDER — BUPIVACAINE HCL 0.25 % IJ SOLN
INTRAMUSCULAR | Status: DC | PRN
  Administered 2024-01-25: 16 mL

## 2024-01-25 MED ORDER — MIDAZOLAM HCL 2 MG/2ML IJ SOLN
INTRAMUSCULAR | Status: AC
  Filled 2024-01-25: qty 2

## 2024-01-25 MED ORDER — ONDANSETRON HCL 4 MG/2ML IJ SOLN
INTRAMUSCULAR | Status: AC
  Filled 2024-01-25: qty 2

## 2024-01-25 MED ORDER — 0.9 % SODIUM CHLORIDE (POUR BTL) OPTIME
TOPICAL | Status: DC | PRN
  Administered 2024-01-25: 1000 mL

## 2024-01-25 MED ORDER — FENTANYL CITRATE (PF) 100 MCG/2ML IJ SOLN
INTRAMUSCULAR | Status: AC
  Filled 2024-01-25: qty 2

## 2024-01-25 MED ORDER — CEFAZOLIN SODIUM-DEXTROSE 2-4 GM/100ML-% IV SOLN
2.0000 g | INTRAVENOUS | Status: AC
  Administered 2024-01-25: 2 g via INTRAVENOUS
  Filled 2024-01-25: qty 100

## 2024-01-25 MED ORDER — SUCCINYLCHOLINE CHLORIDE 200 MG/10ML IV SOSY
PREFILLED_SYRINGE | INTRAVENOUS | Status: DC | PRN
  Administered 2024-01-25: 120 mg via INTRAVENOUS

## 2024-01-25 MED ORDER — AMISULPRIDE (ANTIEMETIC) 5 MG/2ML IV SOLN
10.0000 mg | Freq: Once | INTRAVENOUS | Status: AC | PRN
  Administered 2024-01-25: 10 mg via INTRAVENOUS

## 2024-01-25 MED ORDER — CHLORHEXIDINE GLUCONATE 0.12 % MT SOLN
15.0000 mL | Freq: Once | OROMUCOSAL | Status: AC
  Administered 2024-01-25: 15 mL via OROMUCOSAL
  Filled 2024-01-25: qty 15

## 2024-01-25 MED ORDER — LACTATED RINGERS IV SOLN: INTRAVENOUS | Status: DC

## 2024-01-25 MED ORDER — SODIUM CHLORIDE 0.9 % IV SOLN: 12.5000 mg | INTRAVENOUS | Status: DC | PRN

## 2024-01-25 MED ORDER — MIDAZOLAM HCL 2 MG/2ML IJ SOLN
INTRAMUSCULAR | Status: DC | PRN
  Administered 2024-01-25: 2 mg via INTRAVENOUS

## 2024-01-25 MED ORDER — BUPIVACAINE HCL (PF) 0.25 % IJ SOLN
INTRAMUSCULAR | Status: AC
  Filled 2024-01-25: qty 60

## 2024-01-25 MED ORDER — PRAMIPEXOLE DIHYDROCHLORIDE 1 MG PO TABS: 1.0000 mg | ORAL_TABLET | Freq: Two times a day (BID) | ORAL | 1 refills | Status: DC

## 2024-01-25 MED ORDER — SUGAMMADEX SODIUM 200 MG/2ML IV SOLN
INTRAVENOUS | Status: DC | PRN
  Administered 2024-01-25: 400 mg via INTRAVENOUS

## 2024-01-25 MED ORDER — PHENYLEPHRINE 80 MCG/ML (10ML) SYRINGE FOR IV PUSH (FOR BLOOD PRESSURE SUPPORT)
PREFILLED_SYRINGE | INTRAVENOUS | Status: AC
  Filled 2024-01-25: qty 10

## 2024-01-25 MED ORDER — ONDANSETRON HCL 4 MG/2ML IJ SOLN
INTRAMUSCULAR | Status: DC | PRN
  Administered 2024-01-25: 4 mg via INTRAVENOUS

## 2024-01-25 MED ORDER — DEXAMETHASONE SODIUM PHOSPHATE 10 MG/ML IJ SOLN
INTRAMUSCULAR | Status: DC | PRN
Start: 2024-01-25 — End: 2024-01-25
  Administered 2024-01-25: 10 mg via INTRAVENOUS

## 2024-01-25 MED ORDER — ROCURONIUM BROMIDE 10 MG/ML (PF) SYRINGE
PREFILLED_SYRINGE | INTRAVENOUS | Status: AC
  Filled 2024-01-25: qty 10

## 2024-01-25 MED ORDER — BACITRACIN-NEOMYCIN-POLYMYXIN OINTMENT TUBE
TOPICAL_OINTMENT | CUTANEOUS | Status: AC
  Filled 2024-01-25: qty 14.17

## 2024-01-25 MED ORDER — FENTANYL CITRATE (PF) 250 MCG/5ML IJ SOLN
INTRAMUSCULAR | Status: DC | PRN
  Administered 2024-01-25 (×3): 50 ug via INTRAVENOUS

## 2024-01-25 MED ORDER — SUGAMMADEX SODIUM 200 MG/2ML IV SOLN
INTRAVENOUS | Status: AC
  Filled 2024-01-25: qty 2

## 2024-01-25 MED ORDER — ROCURONIUM BROMIDE 10 MG/ML (PF) SYRINGE
PREFILLED_SYRINGE | INTRAVENOUS | Status: DC | PRN
  Administered 2024-01-25: 10 mg via INTRAVENOUS
  Administered 2024-01-25: 30 mg via INTRAVENOUS

## 2024-01-25 MED ORDER — OXYCODONE HCL 5 MG PO TABS
5.0000 mg | ORAL_TABLET | Freq: Once | ORAL | Status: AC | PRN
  Administered 2024-01-25: 5 mg via ORAL

## 2024-01-25 MED ORDER — OXYCODONE HCL 5 MG PO TABS
ORAL_TABLET | ORAL | Status: AC
  Filled 2024-01-25: qty 1

## 2024-01-25 MED ORDER — LIDOCAINE 2% (20 MG/ML) 5 ML SYRINGE
INTRAMUSCULAR | Status: DC | PRN
  Administered 2024-01-25: 60 mg via INTRAVENOUS

## 2024-01-25 SURGICAL SUPPLY — 55 items
BAG COUNTER SPONGE SURGICOUNT (BAG) ×1 IMPLANT
BAG DECANTER FOR FLEXI CONT (MISCELLANEOUS) ×1 IMPLANT
BENZOIN TINCTURE PRP APPL 2/3 (GAUZE/BANDAGES/DRESSINGS) IMPLANT
BINDER ABDOMINAL 12 ML 46-62 (SOFTGOODS) ×1 IMPLANT
BINDER ABDOMINAL 12 XL 75-84 (SOFTGOODS) IMPLANT
BLADE CLIPPER SURG (BLADE) IMPLANT
CHLORAPREP W/TINT 26 (MISCELLANEOUS) ×1 IMPLANT
CLEANER TIP ELECTROSURG 2X2 (MISCELLANEOUS) ×1 IMPLANT
CLIP TI WIDE RED SMALL 6 (CLIP) IMPLANT
DERMABOND ADVANCED .7 DNX12 (GAUZE/BANDAGES/DRESSINGS) ×1 IMPLANT
DEVICE FIXATE SUTURING DUAL (MISCELLANEOUS) IMPLANT
DRAPE C-ARM 42X72 X-RAY (DRAPES) ×1 IMPLANT
DRAPE C-ARMOR (DRAPES) ×1 IMPLANT
DRAPE LAPAROTOMY 100X72X124 (DRAPES) ×1 IMPLANT
DRAPE SURG 17X23 STRL (DRAPES) ×4 IMPLANT
DRSG OPSITE POSTOP 3X4 (GAUZE/BANDAGES/DRESSINGS) IMPLANT
DRSG OPSITE POSTOP 4X6 (GAUZE/BANDAGES/DRESSINGS) IMPLANT
ELECTRODE REM PT RTRN 9FT ADLT (ELECTROSURGICAL) ×1 IMPLANT
GAUZE 4X4 16PLY ~~LOC~~+RFID DBL (SPONGE) ×1 IMPLANT
GENERATOR SENZA HFX IQ IPG3000 (Generator) IMPLANT
GLOVE BIO SURGEON STRL SZ8 (GLOVE) ×1 IMPLANT
GLOVE EXAM NITRILE XL STR (GLOVE) IMPLANT
GLOVE EXAM NITRILE XS STR PU (GLOVE) IMPLANT
GLOVE SRG 8 PF TXTR STRL LF DI (GLOVE) ×1 IMPLANT
GOWN STRL REUS W/ TWL LRG LVL3 (GOWN DISPOSABLE) IMPLANT
GOWN STRL REUS W/ TWL XL LVL3 (GOWN DISPOSABLE) IMPLANT
GOWN STRL REUS W/TWL 2XL LVL3 (GOWN DISPOSABLE) IMPLANT
KIT ANCHOR LEAD NEUROSTIM N300 (Stimulator) IMPLANT
KIT BASIN OR (CUSTOM PROCEDURE TRAY) ×1 IMPLANT
KIT LEAD NEUROSTIM 70 BLUE (Stimulator) IMPLANT
KIT NDL INSERTION CVD NEU 6 (NEEDLE) IMPLANT
KIT TUNNELER SPINAL CORD 35 (KITS) IMPLANT
KIT TURNOVER KIT B (KITS) ×1 IMPLANT
NDL 18GX1X1/2 (RX/OR ONLY) (NEEDLE) IMPLANT
NDL HYPO 25X1 1.5 SAFETY (NEEDLE) ×1 IMPLANT
NEEDLE 18GX1X1/2 (RX/OR ONLY) (NEEDLE) IMPLANT
NEEDLE HYPO 25X1 1.5 SAFETY (NEEDLE) ×1 IMPLANT
NS IRRIG 1000ML POUR BTL (IV SOLUTION) ×1 IMPLANT
PACK LAMINECTOMY NEURO (CUSTOM PROCEDURE TRAY) ×1 IMPLANT
PAD ARMBOARD POSITIONER FOAM (MISCELLANEOUS) ×1 IMPLANT
SPONGE SURGIFOAM ABS GEL SZ50 (HEMOSTASIS) IMPLANT
SPONGE T-LAP 4X18 ~~LOC~~+RFID (SPONGE) ×1 IMPLANT
STAPLER SKIN PROX WIDE 3.9 (STAPLE) ×1 IMPLANT
STRIP CLOSURE SKIN 1/2X4 (GAUZE/BANDAGES/DRESSINGS) IMPLANT
SUT MNCRL AB 4-0 PS2 18 (SUTURE) IMPLANT
SUT SILK 0 MO-6 18XCR BRD 8 (SUTURE) ×1 IMPLANT
SUT SILK 0 TIES 10X30 (SUTURE) IMPLANT
SUT SILK 2 0 TIES 10X30 (SUTURE) IMPLANT
SUT VIC AB 2-0 CP2 18 (SUTURE) ×2 IMPLANT
SYR 10ML LL (SYRINGE) IMPLANT
SYR EPIDURAL 5ML GLASS (SYRINGE) ×1 IMPLANT
TOWEL GREEN STERILE (TOWEL DISPOSABLE) ×1 IMPLANT
TOWEL GREEN STERILE FF (TOWEL DISPOSABLE) ×1 IMPLANT
WATER STERILE IRR 1000ML POUR (IV SOLUTION) ×1 IMPLANT
YANKAUER SUCT BULB TIP NO VENT (SUCTIONS) ×1 IMPLANT

## 2024-01-25 NOTE — Transfer of Care (Signed)
 Immediate Anesthesia Transfer of Care Note  Patient: Natasha Cole  Procedure(s) Performed: INSERTION, SPINAL CORD STIMULATOR, LUMBAR  Patient Location: PACU  Anesthesia Type:General  Level of Consciousness: awake and alert   Airway & Oxygen Therapy: Patient Spontanous Breathing and Patient connected to face mask oxygen  Post-op Assessment: Report given to RN, Post -op Vital signs reviewed and stable, and Patient moving all extremities  Post vital signs: Reviewed and stable  Last Vitals:  Vitals Value Taken Time  BP 140/63 01/25/24 17:16  Temp    Pulse 69 01/25/24 17:20  Resp 20 01/25/24 17:20  SpO2 91 % 01/25/24 17:20  Vitals shown include unfiled device data.  Last Pain:  Vitals:   01/25/24 1318  TempSrc:   PainSc: 0-No pain         Complications: There were no known notable events for this encounter.

## 2024-01-25 NOTE — H&P (Signed)
 Chief complaint  My back hurts  History of present illness   Patient presents for permanent spinal cord stimulator implant.  She has a history of lumbar spine surgery.  She has had a spinal cord stimulator trial with over 50% benefit.  Her pain is both in her low back and lower extremities.    Medical history  Sleep apnea  Depression  Hypothyroidism Gastroesophageal reflux disease    Medications  Sumatriptan Omeprazole Mirapex  Metoprolol  Lithium   Levothyroxine   Vitamin B12  Crestor    Allergies  No known drug allergies   Surgical history  L4-5 fusion   Social history  Nonsmoker   Family history  Noncontributory   Physical examination  Vital signs reviewed  She is alert, she is appropriate  She has good hip flexion, good leg extension, good foot plantar and dorsiflexion  Abdomen is obese, nontender  Regular breathing pattern  Regular pulse  Midline incision appreciated over the lumbar region otherwise notes significant dermatologic abnormalities appreciated   Assessment plan  53 year old female with lumbar post-laminectomy syndrome, failed back surgical syndrome presents for permanent spinal cord stimulator implantation  Patient will undergo permanent spinal cord stimulator implantation with a Nevro device.  I plan will be for 2 leads   And a rechargeable generator.  The plan will be for home discharge following the procedure.   She will follow-up in clinic in 5 days for dressing change She will follow-up in clinic 5 days later for staple removal.   Prophylactic antibiotics and pain medications have already been sent to her pharmacy for pick up.  She may use oxycodone  5-10 mg every 6 hours as needed for postoperative pain, she will use cephalexin 250 mg every 6 hours over a 10 day course.

## 2024-01-25 NOTE — Anesthesia Procedure Notes (Addendum)
 Procedure Name: Intubation Date/Time: 01/25/2024 3:26 PM  Performed by: Julien Manus, CRNAPre-anesthesia Checklist: Patient identified, Emergency Drugs available, Suction available and Patient being monitored Patient Re-evaluated:Patient Re-evaluated prior to induction Oxygen Delivery Method: Circle system utilized Preoxygenation: Pre-oxygenation with 100% oxygen Induction Type: IV induction Ventilation: Mask ventilation without difficulty Laryngoscope Size: Glidescope and 3 Grade View: Grade I Tube type: Oral Tube size: 7.0 mm Number of attempts: 1 Airway Equipment and Method: Stylet and Oral airway Placement Confirmation: ETT inserted through vocal cords under direct vision, positive ETCO2 and breath sounds checked- equal and bilateral Secured at: 22 cm Tube secured with: Tape Dental Injury: Teeth and Oropharynx as per pre-operative assessment

## 2024-01-25 NOTE — H&P (Signed)
 Cc: my back hurts  HPI  Here for permanent spinal cord stimulator implant. Over 50% benefit from trial.   PMH Obesity OSA GERD Hypothyroid Anxiety/Depression  Surgical Hx Lumbar Laminectomy  Allergies None  Meds Reviewed in EMR  Family Hx Harman  Social Hx Non Smoker  Ros No recent illnesses  PE Vitals reviewed Alert, appropriate Abdomen Soft Good Strength in LE Bilaterally, throughout. No sensory Deficits Regular Breathing pattern Regular pulse  A/P Here for permanent SCS implant -Plan for home d/c.  Cephalexin and oxycodone  sent to pharmacy.  She will take cephalexin X 10 days.  May take 1-2 Oxycodone  5mg  every 6 hrs, #20 sent to pharmacy. -Keep dressings dry. -5 Day clinic follow up for dressing change. -Limit lifting to 5 lbs or less.

## 2024-01-25 NOTE — Op Note (Signed)
 Preoperative Diagnosis: Failed back surgical syndrome  Postoperative Diagnosis: Same  Procedure:  Percutaneous Permanent Spinal Cord Stimulator Lead Implant X 2 under flouroscopy Spinal cord stimulator battery implant  Surgeon: Darlis Deatrice RAMAN.  Anesthesia: General  Procedure note: Patient seen in holding room.  Questions answered, consent obtained.  Taken to OR, placed under general anesthesia by anesthesia care team.  Placed prone with all pressure points padded.  Sterile prep and drape.  Time out performed.  2gm ancef  given.  T11-12interspace identified under flouroscopy.  2 inch linear region over spinous processes anesthetized with 0.25% bupivicaine, see OR record for amount.  Incision made, dissection to supra spinous ligament.  Epidural space accessed with epimed needle utilizing flouroscopy and loss of resistance to air.  8 contact nevro lead threaded to top of T8 at mid line using continuous/pulsed flouroscopy.  2nd lead threaded to top of T 9 in identical fashion; AP and lateral imaging noted good position.  Leads anchored using fixate device and manufacture provided anchors.  Leads secured to anchors with hex wrench.  Final imaging noted good position of leads.  Next, 2 inch linear region anesthetized in left flank with 0.25% bupivicaine.  Incision made, pocket created.  Lead tunnelled from midline to flank pocket.  Leads placed in battery, all parameters satisfactory per our Nevro representative.  Leads secured to battery with hex wrench.  Next each pocket irrigated with saline.  Hemostasis achieved.  Each pocket closed with series of 2-0 vicryl sutures followed by staples and sterile dressing.  Complications None  Implants Nevro Percutaneous lead X 2 Nevro Omnia Battery Fixate device X 2  Fluids: Per anesthesia  Disposition: PACU, then home. Clinic follow up in 5 days.

## 2024-01-25 NOTE — Discharge Instructions (Addendum)
 Discharge home Clinic follow up in 5 days, will be contacted Keep dressing dry Resume regular diet Avoid lifting over 5 lbs Avoid excessive flexion or extension May remove abdominal binder if uncomfortable after 48hrs.

## 2024-01-26 NOTE — Anesthesia Postprocedure Evaluation (Signed)
 Anesthesia Post Note  Patient: Natasha Cole  Procedure(s) Performed: INSERTION, SPINAL CORD STIMULATOR, LUMBAR     Patient location during evaluation: PACU Anesthesia Type: General Level of consciousness: awake and alert Pain management: pain level controlled Vital Signs Assessment: post-procedure vital signs reviewed and stable Respiratory status: spontaneous breathing, nonlabored ventilation, respiratory function stable and patient connected to nasal cannula oxygen Cardiovascular status: blood pressure returned to baseline and stable Postop Assessment: no apparent nausea or vomiting Anesthetic complications: no   There were no known notable events for this encounter.  Last Vitals:  Vitals:   01/25/24 1800 01/25/24 1815  BP: (!) 129/42 (!) 148/66  Pulse: 69 90  Resp: (!) 25 16  Temp:  36.7 C  SpO2: 96% 93%    Last Pain:  Vitals:   01/25/24 1815  TempSrc:   PainSc: 4                  Declynn Lopresti S

## 2024-01-26 NOTE — Telephone Encounter (Signed)
 Apt 9/11 6 wk f/u from 7/29

## 2024-01-30 ENCOUNTER — Encounter (HOSPITAL_COMMUNITY): Payer: Self-pay | Admitting: Pain Medicine

## 2024-02-14 ENCOUNTER — Telehealth: Admitting: Physician Assistant

## 2024-03-02 ENCOUNTER — Other Ambulatory Visit: Payer: Self-pay | Admitting: Neurosurgery

## 2024-03-07 ENCOUNTER — Encounter (HOSPITAL_COMMUNITY): Payer: Self-pay

## 2024-03-07 ENCOUNTER — Other Ambulatory Visit: Payer: Self-pay | Admitting: Physician Assistant

## 2024-03-07 NOTE — Progress Notes (Signed)
 PCP/Internal Med - Darice Sprague, FNP Cardiologist - Leotis Sella, NP  Chest x-ray - n/a EKG - 01/23/24 Stress Test - 07/28/21 [facility] ECHO - 06/04/21 Cardiac Cath - 08/12/21 [facility]  ICD Pacemaker/Loop - n/a  Sleep Study -  Yes CPAP - None.  Per patient, sleep study in 2025 states she no longer has sleep apnea.   Diabetes - n/a  Aspirin & Blood Thinner Instructions:  n/a  ERAS - clear liquids til 11:30 AM DOS.  Anesthesia review: Yes  STOP now taking any Aspirin (unless otherwise instructed by your surgeon), Aleve, Naproxen, Ibuprofen, Motrin, Advil, Goody's, BC's, all herbal medications, fish oil, and all vitamins.   Coronavirus Screening Do you have any of the following symptoms:  Cough yes/no: No Fever (>100.51F)  yes/no: No Runny nose yes/no: No Sore throat yes/no: No Difficulty breathing/shortness of breath  yes/no: No  Have you traveled in the last 14 days and where? yes/no: No  Patient verbalized understanding of instructions that were given to them at the PAT appointment. Patient was also instructed that they will need to review over the PAT instructions again at home before surgery.

## 2024-03-07 NOTE — Progress Notes (Signed)
 Surgical Instructions   Your procedure is scheduled on Friday, 03/09/24. Report to [facility] Main Entrance A at 12:35 P.M., then check in with the Admitting office. Any questions or running late day of surgery: call [phone]  Questions prior to your surgery date: call [phone], Monday-Friday, 8am-4pm. If you experience any cold or flu symptoms such as cough, fever, chills, shortness of breath, etc. between now and your scheduled surgery, please notify us  at the above number.     Remember:  Do not eat after midnight the night before your surgery-Thursday  You may drink clear liquids until 11:30 AM, the morning of your surgery.   Clear liquids allowed are: Water, Non-Citrus Juices (without pulp), Carbonated Beverages, Clear Tea (no milk, honey, etc.), Black Coffee Only (NO MILK, CREAM OR POWDERED CREAMER of any kind), and Gatorade.    Take these medicines the morning of surgery with A SIP OF WATER: busPIRone  (BUSPAR )  levothyroxine  (SYNTHROID )  lithium  carbonate  metoprolol  succinate (TOPROL -XL)  pramipexole  (MIRAPEX )  rosuvastatin  (CRESTOR )    May take these medicines IF NEEDED: ondansetron  (ZOFRAN -ODT) or Compazine  SUMAtriptan  (IMITREX )    One week prior to surgery, STOP taking any Aspirin (unless otherwise instructed by your surgeon) Aleve, Naproxen, Ibuprofen, Motrin, Advil, Goody's, BC's, all herbal medications, fish oil, and non-prescription vitamins.           If you use a CPAP at night, you may bring your mask/headgear for your overnight stay.   You will be asked to remove any contacts, glasses, piercing's, hearing aid's, dentures/partials prior to surgery. Please bring cases for these items if needed.    SURGICAL WAITING ROOM VISITATION Patients may have no more than 2 support people in the waiting area - these visitors may rotate.   Pre-op nurse will coordinate an appropriate time for 1 ADULT support person, who may not rotate, to accompany patient in  pre-op.  Children under the age of 41 must have an adult with them who is not the patient and must remain in the main waiting area with an adult.  If the patient needs to stay at the hospital during part of their recovery, the visitor guidelines for inpatient rooms apply.  Please refer to the [facility] website for the visitor guidelines for any additional information.   If you received a COVID test during your pre-op visit  it is requested that you wear a mask when out in public, stay away from anyone that may not be feeling well and notify your surgeon if you develop symptoms. If you have been in contact with anyone that has tested positive in the last 10 days please notify you surgeon.      Pre-operative 5 CHG Bathing Instructions   You can play a key role in reducing the risk of infection after surgery. Your skin needs to be as free of germs as possible. You can reduce the number of germs on your skin by washing with CHG (chlorhexidine  gluconate) soap before surgery. CHG is an antiseptic soap that kills germs and continues to kill germs even after washing.   DO NOT use if you have an allergy  to chlorhexidine /CHG or antibacterial soaps. If your skin becomes reddened or irritated, stop using the CHG and notify one of our RNs at [phone].   Please shower with the CHG soap starting 4 days before surgery using the following schedule:     Please keep in mind the following:  DO NOT shave, including legs and underarms, starting the day  of your first shower.   You may shave your face at any point before/day of surgery.  Place clean sheets on your bed the day you start using CHG soap. Use a clean washcloth (not used since being washed) for each shower. DO NOT sleep with pets once you start using the CHG.   CHG Shower Instructions:  Wash your face and private area with normal soap. If you choose to wash your hair, wash first with your normal shampoo.  After you use shampoo/soap, rinse  your hair and body thoroughly to remove shampoo/soap residue.  Turn the water OFF and apply about 3 tablespoons (45 ml) of CHG soap to a CLEAN washcloth.  Apply CHG soap ONLY FROM YOUR NECK DOWN TO YOUR TOES (washing for 3-5 minutes)  DO NOT use CHG soap on face, private areas, open wounds, or sores.  Pay special attention to the area where your surgery is being performed.  If you are having back surgery, having someone wash your back for you may be helpful. Wait 2 minutes after CHG soap is applied, then you may rinse off the CHG soap.  Pat dry with a clean towel  Put on clean clothes/pajamas   If you choose to wear lotion, please use ONLY the CHG-compatible lotions that are listed below.  Additional instructions for the day of surgery: DO NOT APPLY any lotions, deodorants, cologne, or perfumes.   Do not bring valuables to the hospital. [facility] is not responsible for any belongings/valuables. Do not wear nail polish, gel polish, artificial nails, or any other type of covering on natural nails (fingers and toes) Do not wear jewelry or makeup Put on clean/comfortable clothes.  Please brush your teeth.  Ask your nurse before applying any prescription medications to the skin.     CHG Compatible Lotions   Aveeno Moisturizing lotion  Cetaphil Moisturizing Cream  Cetaphil Moisturizing Lotion  Clairol Herbal Essence Moisturizing Lotion, Dry Skin  Clairol Herbal Essence Moisturizing Lotion, Extra Dry Skin  Clairol Herbal Essence Moisturizing Lotion, Normal Skin  Curel Age Defying Therapeutic Moisturizing Lotion with Alpha Hydroxy  Curel Extreme Care Body Lotion  Curel Soothing Hands Moisturizing Hand Lotion  Curel Therapeutic Moisturizing Cream, Fragrance-Free  Curel Therapeutic Moisturizing Lotion, Fragrance-Free  Curel Therapeutic Moisturizing Lotion, Original Formula  Eucerin Daily Replenishing Lotion  Eucerin Dry Skin Therapy Plus Alpha Hydroxy Crme  Eucerin Dry Skin Therapy  Plus Alpha Hydroxy Lotion  Eucerin Original Crme  Eucerin Original Lotion  Eucerin Plus Crme Eucerin Plus Lotion  Eucerin TriLipid Replenishing Lotion  Keri Anti-Bacterial Hand Lotion  Keri Deep Conditioning Original Lotion Dry Skin Formula Softly Scented  Keri Deep Conditioning Original Lotion, Fragrance Free Sensitive Skin Formula  Keri Lotion Fast Absorbing Fragrance Free Sensitive Skin Formula  Keri Lotion Fast Absorbing Softly Scented Dry Skin Formula  Keri Original Lotion  Keri Skin Renewal Lotion Keri Silky Smooth Lotion  Keri Silky Smooth Sensitive Skin Lotion  Nivea Body Creamy Conditioning Oil  Nivea Body Extra Enriched Lotion  Nivea Body Original Lotion  Nivea Body Sheer Moisturizing Lotion Nivea Crme  Nivea Skin Firming Lotion  NutraDerm 30 Skin Lotion  NutraDerm Skin Lotion  NutraDerm Therapeutic Skin Cream  NutraDerm Therapeutic Skin Lotion  ProShield Protective Hand Cream  Provon moisturizing lotion  Please read over the following fact sheets that you were given.

## 2024-03-08 ENCOUNTER — Encounter: Payer: Self-pay | Admitting: Physician Assistant

## 2024-03-08 ENCOUNTER — Encounter (HOSPITAL_COMMUNITY)
Admission: RE | Admit: 2024-03-08 | Discharge: 2024-03-08 | Disposition: A | Discharge status: Home / Self Care | Source: Ambulatory Visit | Attending: Neurosurgery | Admitting: Neurosurgery

## 2024-03-08 ENCOUNTER — Telehealth: Admitting: Physician Assistant

## 2024-03-08 ENCOUNTER — Encounter (HOSPITAL_COMMUNITY): Payer: Self-pay

## 2024-03-08 ENCOUNTER — Other Ambulatory Visit: Payer: Self-pay

## 2024-03-08 VITALS — BP 160/50 | HR 75 | Temp 97.7°F | Resp 16 | Ht 63.0 in | Wt 356.5 lb

## 2024-03-08 DIAGNOSIS — G2581 Restless legs syndrome: Secondary | ICD-10-CM | POA: Diagnosis not present

## 2024-03-08 DIAGNOSIS — E785 Hyperlipidemia, unspecified: Secondary | ICD-10-CM | POA: Insufficient documentation

## 2024-03-08 DIAGNOSIS — M48062 Spinal stenosis, lumbar region with neurogenic claudication: Secondary | ICD-10-CM | POA: Insufficient documentation

## 2024-03-08 DIAGNOSIS — R7303 Prediabetes: Secondary | ICD-10-CM | POA: Insufficient documentation

## 2024-03-08 DIAGNOSIS — Z01812 Encounter for preprocedural laboratory examination: Secondary | ICD-10-CM | POA: Insufficient documentation

## 2024-03-08 DIAGNOSIS — F329 Major depressive disorder, single episode, unspecified: Secondary | ICD-10-CM

## 2024-03-08 DIAGNOSIS — F411 Generalized anxiety disorder: Secondary | ICD-10-CM | POA: Diagnosis not present

## 2024-03-08 DIAGNOSIS — N183 Chronic kidney disease, stage 3 unspecified: Secondary | ICD-10-CM | POA: Insufficient documentation

## 2024-03-08 DIAGNOSIS — G4700 Insomnia, unspecified: Secondary | ICD-10-CM | POA: Diagnosis not present

## 2024-03-08 DIAGNOSIS — I493 Ventricular premature depolarization: Secondary | ICD-10-CM | POA: Insufficient documentation

## 2024-03-08 DIAGNOSIS — I129 Hypertensive chronic kidney disease with stage 1 through stage 4 chronic kidney disease, or unspecified chronic kidney disease: Secondary | ICD-10-CM | POA: Insufficient documentation

## 2024-03-08 DIAGNOSIS — I472 Ventricular tachycardia, unspecified: Secondary | ICD-10-CM | POA: Insufficient documentation

## 2024-03-08 DIAGNOSIS — E039 Hypothyroidism, unspecified: Secondary | ICD-10-CM | POA: Insufficient documentation

## 2024-03-08 DIAGNOSIS — Z01818 Encounter for other preprocedural examination: Secondary | ICD-10-CM

## 2024-03-08 HISTORY — DX: Generalized anxiety disorder: F41.1

## 2024-03-08 HISTORY — DX: Pneumonia, unspecified organism: J18.9

## 2024-03-08 HISTORY — DX: Presence of other specified functional implants: Z96.89

## 2024-03-08 HISTORY — DX: Headache, unspecified: R51.9

## 2024-03-08 LAB — CBC
HCT: 46.6 % — ABNORMAL HIGH (ref 36.0–46.0)
Hemoglobin: 14.1 g/dL (ref 12.0–15.0)
MCH: 28.9 pg (ref 26.0–34.0)
MCHC: 30.3 g/dL (ref 30.0–36.0)
MCV: 95.5 fL (ref 80.0–100.0)
Platelets: 308 K/uL (ref 150–400)
RBC: 4.88 MIL/uL (ref 3.87–5.11)
RDW: 14.6 % (ref 11.5–15.5)
WBC: 9.1 K/uL (ref 4.0–10.5)
nRBC: 0 % (ref 0.0–0.2)

## 2024-03-08 LAB — BASIC METABOLIC PANEL WITH GFR
Anion gap: 8 (ref 5–15)
BUN: 9 mg/dL (ref 6–20)
CO2: 26 mmol/L (ref 22–32)
Calcium: 9.5 mg/dL (ref 8.9–10.3)
Chloride: 109 mmol/L (ref 98–111)
Creatinine, Ser: 1.32 mg/dL — ABNORMAL HIGH (ref 0.44–1.00)
GFR, Estimated: 48 mL/min — ABNORMAL LOW (ref >60)
Glucose, Bld: 105 mg/dL — ABNORMAL HIGH (ref 70–99)
Potassium: 4.6 mmol/L (ref 3.5–5.1)
Sodium: 143 mmol/L (ref 135–145)

## 2024-03-08 LAB — SURGICAL PCR SCREEN
MRSA, PCR: NEGATIVE
Staphylococcus aureus: NEGATIVE

## 2024-03-08 MED ORDER — LITHIUM CARBONATE 150 MG PO CAPS: 450.0000 mg | ORAL_CAPSULE | Freq: Every day | ORAL | 1 refills | Status: AC

## 2024-03-08 MED ORDER — ALPRAZOLAM 0.5 MG PO TABS: 0.2500 mg | ORAL_TABLET | Freq: Two times a day (BID) | ORAL | 5 refills | Status: AC | PRN

## 2024-03-08 NOTE — Progress Notes (Signed)
 Anesthesia Chart Review:  Case: [id number] Date/Time: 03/09/24 1420   Procedure: LUMBAR LAMINECTOMY/DECOMPRESSION MICRODISCECTOMY 1 LEVEL - Thoracic laminectomy T9   Anesthesia type: General   Diagnosis: Spinal stenosis, lumbar region, with neurogenic claudication [M48.062]   Pre-op diagnosis: Spinal stenosis lumbar region with neurogenic claudication   [facility]: [facility] ROOM 21 / [facility]   Surgeons: Gillie Duncans, MD       DISCUSSION: Patient is a 53 year old female scheduled for the above procedure.   History includes never smoker, HTN, dysrhythmia (PVCs, NSVT), hypothyroidism, CKD (stage 3), GERD, anemia, prediabetes, OSA (does not use CPAP; reported recent f/u sleep study after weight loss did not show OSA, report not currently available), MDD (s/p ECT 2017), panic attacks, spinal surgery (L4-5 PLIF 09/28/21; spinal cord stimulator implant 01/25/2024, leads thread to top of T8 and T9), gastric bypass (2012, reversal 2018).     Patient follows with cardiology at [facility] for HLD, NSVT (two 4 beat runs on monitor 05/2021), PVCs (22% PVC burden by monitor 05/2021, asymptomatic on metoprolol ). Echo 05/2021 showed normal LV systolic function, no significant valvular abnormalities.  She had an abnormal nuclear stress 07/28/2021 that was followed up by cardiac catheterization 08/12/2021 which showed normal coronaries.  Stress test was deemed to be false positive.  April 2024 Holter monitor showed predominant sinus rhythm, occasional PAC, rare PVCs, 2 brief AT run lasting up to 12 beats. She was last seen 06/10/23 by Tye Leach, NP. Overall doing well from a cardiac standpoint at that time. PVC burden had improved on metoprolol  and no changes made. Next follow-up is scheduled for around September.    Staff asked her to bring her spinal cord stimulator remote, although she reportedly says it stays off.   Anesthesia team to evaluate on the day of surgery.   VS: BP (!) 160/50   Pulse 75   Temp 36.5  C   Resp 16   Ht 5' 3 (1.6 m)   Wt (!) 161.7 kg   SpO2 100%   BMI 63.15 kg/m   PROVIDERS: Brown-Patram, Eleanor Rung, NP  is PCP  Tye Leach, NP is cardiology provider   LABS: Labs reviewed: Acceptable for surgery. (all labs ordered are listed, but only abnormal results are displayed)  Labs Reviewed  CBC - Abnormal; Notable for the following components:      Result Value   HCT 46.6 (*)    All other components within normal limits  BASIC METABOLIC PANEL WITH GFR - Abnormal; Notable for the following components:   Glucose, Bld 105 (*)    Creatinine, Ser 1.32 (*)    GFR, Estimated 48 (*)    All other components within normal limits  SURGICAL PCR SCREEN     IMAGES: Xray Thoracolumbar spine 01/25/2024: FINDINGS: Provided history of spinal cord stimulator insertion. There is a single spot radiograph of lower thoracic spine. Neurostimulator device is overlying the midline.   MRI L spine 10/09/2023: IMPRESSION: 1. Previous posterior decompression, diskectomy and fusion procedure at L4-5. Normal alignment with wide patency of the canal and foramina. 2. Mild facet arthritis at L3-4 and L5-S1. No stenosis. 3. Chronic presumably postsurgical fluid collection in the subcutaneous fat in the midline behind L3 and L4 measuring approximately 6 x 4.5 x 6.5 cm. This appears mature with a circumferential capsule. The adjacent fat is unremarkable. No sign that this represents an ongoing pseudomeningocele.   MRI T-spine 10/09/2023: IMPRESSION: Normal MRI of the thoracic spine. No finding that would appear to present a  complication with respect to neurostimulator placement.     EKG: 01/23/2024: Sinus rhythm with Premature atrial complexes Otherwise normal ECG No previous ECGs available Confirmed by Claudene Pacific [id number]) on 01/23/2024 6:27:47 PM    CV: Holter Monitor 3-7 days 10/20/2022 ([facility]): FINAL PROVIDER INTERPRETATION  Agree with Findings.  Occasional PAC s   Rare PVC s  2 Atrial Tachycardia runs occurred, the run with the fastest interval lasting 12 beats with a max rate of 145 bpm  avg 133 bpm ; the  run with the fastest interval was also the longest   PRELIMINARY FINDINGS  Patient had a min HR of 51 bpm, max HR of 145 bpm, and avg HR of 73 bpm. Predominant underlying rhythm was Sinus Rhythm. 2 Supraventricular Tachycardia runs occurred, the run with the fastest interval lasting 12 beats with a max rate of 145 bpm  avg 133 bpm ; the run with the fastest interval was also the longest. Isolated SVEs were occasional  4.4%, 30995 , SVE Couplets were rare  <1.0%, 128 , and SVE Triplets were rare  <1.0%, 38 . Isolated VEs were rare  <1.0% , and no VE Couplets or VE Triplets were present.      Cath 08/12/2021 (Care Everywhere): CONCLUSIONS:    1.  Normal coronary arteries.  2.  Left ventriculography not done to conserve contrast.  Also she began  to have right radial artery spasm and I did not want to manipulate  catheter into ventricle as it would aggravate the spasm.  3. RRA access.  Some radial artery spasm, treated with verapamil.       TTE 06/04/2021 ([facility]): SUMMARY  The left ventricular size is normal.  Mild left ventricular hypertrophy  Left ventricular systolic function is normal.  LV ejection fraction = 60-65%.  The right ventricle is normal in size and function.  There was insufficient TR detected to calculate RV systolic pressure.  The IVC is normal in size with an inspiratory collapse of greater then  50%, suggesting normal right atrial pressure.  There is no significant valvular stenosis or regurgitation.  There is no pericardial effusion.  There is no comparison study available.       Past Medical History:  Diagnosis Date   Anemia    iron-deficiency anemia   Arthritis    back and hips   Chronic kidney disease    stage 3b   DDD (degenerative disc disease), lumbar    Depression    Dysrhythmia    PVCs - on Metoprolol     Generalized anxiety disorder    GERD (gastroesophageal reflux disease)    Headache    History of kidney stones    Hypertension    Hypothyroidism    Insomnia 12/20/2023   Nonsustained ventricular tachycardia (HCC) 06/25/2021   pt had two 4-beat runs   Panic attacks    Pneumonia    x 1   Pre-diabetes    diet controlled   PVC (premature ventricular contraction)    Sleep apnea    per patient, sleep study in 2025 states she no longer has sleep apnea   Spinal cord stimulator status    present but not turned on - pt informed to bring remote on DOS 03/09/24    Past Surgical History:  Procedure Laterality Date   ANKLE SURGERY Right    CARDIAC CATHETERIZATION  08/12/2021   CHOLECYSTECTOMY     COLONOSCOPY  10/2015   COLONOSCOPY     x multiple  gastic bypass  2012   MRI  09/22/2022   Brain w/o contrast; in [facility]   POSTERIOR LUMBAR FUSION  09/27/2021   reversal gastric bypass  2018   SPINAL CORD STIMULATOR INSERTION N/A 01/25/2024   Procedure: INSERTION, SPINAL CORD STIMULATOR, LUMBAR;  Surgeon: Darlis Deatrice RAMAN, MD;  Location: [facility] OR;  Service: Neurosurgery;  Laterality: N/A;  SCS PER W/NEVRO   TUBAL LIGATION     UPPER GI ENDOSCOPY  02/08/2022   in [facility]    MEDICATIONS:  ALPRAZolam  (XANAX ) 0.5 MG tablet   B-Complex CAPS   Biotin  89999 MCG TABS   busPIRone  (BUSPAR ) 30 MG tablet   Cholecalciferol  (VITAMIN D ) 50 MCG (2000 UT) tablet   EPINEPHrine  0.3 mg/0.3 mL IJ SOAJ injection   ferrous sulfate  325 (65 FE) MG tablet   levothyroxine  (SYNTHROID ) 75 MCG tablet   lithium  carbonate 150 MG capsule   methocarbamol  (ROBAXIN ) 500 MG tablet   metoprolol  succinate (TOPROL -XL) 25 MG 24 hr tablet   mirtazapine  (REMERON ) 15 MG tablet   mirtazapine  (REMERON ) 45 MG tablet   ondansetron  (ZOFRAN -ODT) 8 MG disintegrating tablet   pramipexole  (MIRAPEX ) 1 MG tablet   prochlorperazine (COMPAZINE) 10 MG tablet   rosuvastatin  (CRESTOR ) 10 MG tablet   SUMAtriptan  (IMITREX ) 100 MG tablet   Vitamin D ,  Ergocalciferol , (DRISDOL ) 1.25 MG (50000 UNIT) CAPS capsule   Vitamin E  180 MG (400 UNIT) CAPS   zaleplon  (SONATA ) 10 MG capsule    omalizumab  (XOLAIR ) injection 300 mg    Srikar Chiang, PA-C Surgical Short Stay/Anesthesiology [facility] Phone [phone] [facility] Phone [phone] 03/08/2024 6:27 PM

## 2024-03-08 NOTE — Anesthesia Preprocedure Evaluation (Signed)
 Anesthesia Evaluation  Patient identified by MRN, date of birth, ID band Patient awake    Reviewed: Allergy  & Precautions, NPO status , Patient's Chart, lab work & pertinent test results, reviewed documented beta blocker date and time   History of Anesthesia Complications Negative for: history of anesthetic complications  Airway Mallampati: III  TM Distance: >3 FB     Dental no notable dental hx.    Pulmonary sleep apnea , pneumonia, neg COPD   breath sounds clear to auscultation       Cardiovascular hypertension, (-) CAD, (-) Past MI and (-) Cardiac Stents + dysrhythmias  Rhythm:Regular Rate:Normal  cardiac catheterization 08/12/2021 which showed normal coronaries   Neuro/Psych  Headaches, neg Seizures PSYCHIATRIC DISORDERS Anxiety Depression       GI/Hepatic ,GERD  ,,(+) neg Cirrhosis        Endo/Other  Hypothyroidism  Class 4 obesity  Renal/GU CRFRenal disease     Musculoskeletal  (+) Arthritis ,    Abdominal   Peds  Hematology  (+) Blood dyscrasia, anemia   Anesthesia Other Findings   Reproductive/Obstetrics                              Anesthesia Physical Anesthesia Plan  ASA: 3  Anesthesia Plan: General   Post-op Pain Management:    Induction: Intravenous  PONV Risk Score and Plan: 3 and Ondansetron  and Dexamethasone   Airway Management Planned: Oral ETT  Additional Equipment:   Intra-op Plan:   Post-operative Plan: Extubation in OR  Informed Consent: I have reviewed the patients History and Physical, chart, labs and discussed the procedure including the risks, benefits and alternatives for the proposed anesthesia with the patient or authorized representative who has indicated his/her understanding and acceptance.     Dental advisory given  Plan Discussed with: CRNA  Anesthesia Plan Comments: (PAT note written 03/08/2024 by Elo Marmolejos, PA-C.  )          Anesthesia Quick Evaluation

## 2024-03-08 NOTE — Progress Notes (Signed)
 [facility]  Patient ID: Natasha Cole,  MRN: [id number]  PCP: Benson Eleanor Rung, NP   Date of Evaluation: 03/08/2024 Time spent:20 minutes  Chief Complaint:  routine f/u  Virtual Visit via Telehealth  I connected with patient by a video enabled telemedicine application with their informed consent, and verified patient privacy and that I am speaking with the correct person using two identifiers.  I am private, in my office and the patient is at home.  I discussed the limitations, risks, security and privacy concerns of performing an evaluation and management service by video and the availability of in person appointments. I also discussed with the patient that there may be a patient responsible charge related to this service. The patient expressed understanding and agreed to proceed.   I discussed the assessment and treatment plan with the patient. The patient was provided an opportunity to ask questions and all were answered. The patient agreed with the plan and demonstrated an understanding of the instructions.   The patient was advised to call back or seek an in-person evaluation if the symptoms worsen or if the condition fails to improve as anticipated.  I provided  20 minutes of non-face-to-face time during this encounter.  HISTORY/CURRENT STATUS: For routine med check.   States she's about the same as far as her mental health is concerned.  The Xanax  has helped with the anxiety.  She still has situational nervousness and also depression at times but feels like the increase in pramipexole  a few months ago has been helpful.  She still enjoys being with her grandkids.  ADLs are at her baseline.  Personal hygiene is normal.  Appetite is normal.  She does not cry easily.  Sleeps fairly well.  No delirium, mania, psychosis, SI/HI.  Having spinal cord stimulator surgery tomorrow.  Had it done in July but it did not work so she has to have it done again.  States  she will feel much better when the pain is better controlled.  Individual Medical History/ Review of Systems: Changes? :Yes    see HPI.  Past medications for mental health diagnoses include: Lithium  (dose higher than 450 mg daily caused slurred speech) Seroquel, Latuda, Abilify, Remeron , BuSpar , Effexor, Deplin, Rexulti, Risperdal, Cymbalta, Prozac, Pristiq, Lamictal , Wellbutrin, trazodone, Viibryd, Zoloft, Mirapex , Klonopin, imipramine  caused rash and itching, Lamictal  caused rash  Allergies: Imipramine , Lamictal  [lamotrigine ], and Nsaids  Current Medications:  Current Outpatient Medications:    B-Complex CAPS, Take 1 capsule by mouth daily., Disp: 30 capsule, Rfl:    Biotin  89999 MCG TABS, Take 10,000 mcg by mouth daily., Disp: , Rfl:    busPIRone  (BUSPAR ) 30 MG tablet, TAKE 1 TABLET BY MOUTH TWICE DAILY GENERIC EQUIVALENT FOR BUSPAR , Disp: 180 tablet, Rfl: 1   Cholecalciferol  (VITAMIN D ) 50 MCG (2000 UT) tablet, Take 1 tablet (2,000 Units total) by mouth daily., Disp: 30 tablet, Rfl: 11   ferrous sulfate  325 (65 FE) MG tablet, Take 325 mg by mouth every other day., Disp: , Rfl:    levothyroxine  (SYNTHROID ) 75 MCG tablet, Take 75 mcg by mouth daily before breakfast., Disp: , Rfl:    metoprolol  succinate (TOPROL -XL) 25 MG 24 hr tablet, Take 25 mg by mouth daily., Disp: , Rfl:    mirtazapine  (REMERON ) 15 MG tablet, Take 1 tablet po with 45 mg qhs = 60 mg, Disp: 90 tablet, Rfl: 1   mirtazapine  (REMERON ) 45 MG tablet, Take 1 tablet (45 mg total) by mouth at bedtime., Disp: 90 tablet, Rfl:  1   ondansetron  (ZOFRAN -ODT) 8 MG disintegrating tablet, Take 8 mg by mouth every 8 (eight) hours as needed for vomiting or nausea., Disp: , Rfl:    prochlorperazine (COMPAZINE) 10 MG tablet, Take 10 mg by mouth every 6 (six) hours as needed for vomiting or nausea., Disp: , Rfl:    rosuvastatin  (CRESTOR ) 10 MG tablet, TAKE 1 TABLET BY MOUTH ONCE DAILY, Disp: , Rfl:    SUMAtriptan  (IMITREX ) 100 MG tablet, Take  100 mg by mouth every 2 (two) hours as needed for migraine., Disp: , Rfl:    Vitamin D , Ergocalciferol , (DRISDOL ) 1.25 MG (50000 UNIT) CAPS capsule, Take 1 capsule (50,000 Units total) by mouth 2 (two) times a week., Disp: 24 capsule, Rfl: 1   Vitamin E  180 MG (400 UNIT) CAPS, Take 400 Units by mouth daily., Disp: , Rfl:    zaleplon  (SONATA ) 10 MG capsule, 1 p.o. nightly as needed sleep.  May repeat one more for mid nocturnal awakening as long as she has 3 to 4 hours left to sleep., Disp: 60 capsule, Rfl: 5   ALPRAZolam  (XANAX ) 0.5 MG tablet, Take 0.5-1.5 tablets (0.25-0.75 mg total) by mouth 2 (two) times daily as needed for anxiety., Disp: 60 tablet, Rfl: 5   EPINEPHrine  0.3 mg/0.3 mL IJ SOAJ injection, Use as directed for life threatening allergic reactions only (Patient not taking: Reported on 03/06/2024), Disp: 2 each, Rfl: 3   lithium  carbonate 150 MG capsule, Take 3 capsules (450 mg total) by mouth daily., Disp: 270 capsule, Rfl: 1   methocarbamol  (ROBAXIN ) 500 MG tablet, Take 1 tablet (500 mg total) by mouth every 6 (six) hours as needed for muscle spasms. (Patient not taking: Reported on 12/31/2021), Disp: 30 tablet, Rfl: 1   oxyCODONE  (OXY IR/ROXICODONE ) 5 MG immediate release tablet, Take 1 tablet (5 mg total) by mouth every 3 (three) hours as needed for moderate pain (pain score 4-6)., Disp: 30 tablet, Rfl: 0   pramipexole  (MIRAPEX ) 1 MG tablet, Take 1 tablet (1 mg total) by mouth in the morning and at bedtime., Disp: 180 tablet, Rfl: 0   tiZANidine  (ZANAFLEX ) 4 MG tablet, Take 1 tablet (4 mg total) by mouth every 6 (six) hours as needed for muscle spasms., Disp: 60 tablet, Rfl: 0  Current Facility-Administered Medications:    omalizumab  (XOLAIR ) injection 300 mg, 300 mg, Subcutaneous, Q28 days, Kozlow, Eric J, MD, 300 mg at 04/17/20 0930 Medication Side Effects: none  Family Medical/ Social History: Changes? No  MENTAL HEALTH EXAM:  There were no vitals taken for this visit.There is no  height or weight on file to calculate BMI.  General Appearance: Casual, Well Groomed, and Obese  Eye Contact:  Good  Speech:  Clear and Coherent  Volume:  Normal  Mood:  Euthymic  Affect:  Congruent  Thought Process:  Goal Directed and Descriptions of Associations: Circumstantial  Orientation:  Full (Time, Place, and Person)  Thought Content: Logical   Suicidal Thoughts:  No  Homicidal Thoughts:  No  Memory:  WNL  Judgement:  Good  Insight:  Good  Psychomotor Activity:  Normal  Concentration:  Concentration: Good and Attention Span: Good  Recall:  Good  Fund of Knowledge: Good  Language: Good  Assets:  Communication Skills Desire for Improvement Financial Resources/Insurance Housing Transportation  ADL's:  Intact  Cognition: WNL  Prognosis:  Good   DIAGNOSES:    ICD-10-CM   1. Treatment-resistant depression  F32.9     2. Generalized anxiety disorder  F41.1  3. Insomnia, unspecified type  G47.00     4. Restless leg syndrome  G25.81       Receivng Psychotherapy: No   RECOMMENDATIONS:  PDMP was reviewed.  Sonata  filled 02/16/2024.  Xanax  filled 01/09/2024.  Small quantity of oxycodone  given 01/25/2024. I provided approximately 20 minutes of non-face to face time during this encounter, including time spent before and after the visit in records review, medical decision making, counseling pertinent to today's visit, and charting.   Overall she is doing better with the current medications.  No changes will be made.  Continue Xanax  0.5 mg, 1/2 to 1-1/2 pills twice daily as needed anxiety. Continue BuSpar  30 mg, 1 p.o. twice daily. Continue levothyroxine  Rx by PCP. Continue lithium  150 mg, 3 p.o. nightly. Continue mirtazapine   60 mg. Continue pramipexole  1 mg, 1 p.o. twice daily.   Continue Sonata  10 mg, 1 p.o. nightly as needed sleep may repeat 1 for mid nocturnal awakening as long as she has 3 to 4 hours left to sleep. Return in 3 months.  Verneita Cooks, PA-C

## 2024-03-09 ENCOUNTER — Ambulatory Visit (HOSPITAL_COMMUNITY)
Admission: RE | Admit: 2024-03-09 | Discharge: 2024-03-10 | Disposition: A | Discharge status: Home / Self Care | Attending: Neurosurgery | Admitting: Neurosurgery

## 2024-03-09 ENCOUNTER — Encounter (HOSPITAL_COMMUNITY): Payer: Self-pay

## 2024-03-09 ENCOUNTER — Ambulatory Visit (HOSPITAL_COMMUNITY)

## 2024-03-09 ENCOUNTER — Encounter (HOSPITAL_COMMUNITY)
Admission: RE | Disposition: A | Discharge status: Home / Self Care | Payer: Self-pay | Source: Home / Self Care | Attending: Neurosurgery

## 2024-03-09 ENCOUNTER — Ambulatory Visit (HOSPITAL_BASED_OUTPATIENT_CLINIC_OR_DEPARTMENT_OTHER): Payer: Self-pay

## 2024-03-09 ENCOUNTER — Encounter (HOSPITAL_COMMUNITY): Payer: Self-pay | Admitting: Neurosurgery

## 2024-03-09 ENCOUNTER — Other Ambulatory Visit: Payer: Self-pay

## 2024-03-09 DIAGNOSIS — G8929 Other chronic pain: Secondary | ICD-10-CM | POA: Diagnosis not present

## 2024-03-09 DIAGNOSIS — G4730 Sleep apnea, unspecified: Secondary | ICD-10-CM | POA: Insufficient documentation

## 2024-03-09 DIAGNOSIS — Z6841 Body Mass Index (BMI) 40.0 and over, adult: Secondary | ICD-10-CM | POA: Diagnosis not present

## 2024-03-09 DIAGNOSIS — M48062 Spinal stenosis, lumbar region with neurogenic claudication: Secondary | ICD-10-CM

## 2024-03-09 DIAGNOSIS — E039 Hypothyroidism, unspecified: Secondary | ICD-10-CM | POA: Insufficient documentation

## 2024-03-09 DIAGNOSIS — D649 Anemia, unspecified: Secondary | ICD-10-CM | POA: Insufficient documentation

## 2024-03-09 DIAGNOSIS — I10 Essential (primary) hypertension: Secondary | ICD-10-CM

## 2024-03-09 DIAGNOSIS — E6689 Other obesity not elsewhere classified: Secondary | ICD-10-CM | POA: Diagnosis not present

## 2024-03-09 DIAGNOSIS — T85192A Other mechanical complication of implanted electronic neurostimulator (electrode) of spinal cord, initial encounter: Secondary | ICD-10-CM | POA: Diagnosis present

## 2024-03-09 DIAGNOSIS — F32A Depression, unspecified: Secondary | ICD-10-CM | POA: Insufficient documentation

## 2024-03-09 DIAGNOSIS — F419 Anxiety disorder, unspecified: Secondary | ICD-10-CM | POA: Diagnosis not present

## 2024-03-09 DIAGNOSIS — F418 Other specified anxiety disorders: Secondary | ICD-10-CM | POA: Diagnosis not present

## 2024-03-09 HISTORY — PX: LUMBAR LAMINECTOMY/DECOMPRESSION MICRODISCECTOMY: SHX5026

## 2024-03-09 SURGERY — LUMBAR LAMINECTOMY/DECOMPRESSION MICRODISCECTOMY 1 LEVEL
Anesthesia: General | Site: Back

## 2024-03-09 MED ORDER — OXYCODONE HCL 5 MG PO TABS
ORAL_TABLET | ORAL | Status: AC
  Filled 2024-03-09: qty 1

## 2024-03-09 MED ORDER — FENTANYL CITRATE (PF) 250 MCG/5ML IJ SOLN
INTRAMUSCULAR | Status: AC
  Filled 2024-03-09: qty 5

## 2024-03-09 MED ORDER — PHENYLEPHRINE HCL-NACL 20-0.9 MG/250ML-% IV SOLN
INTRAVENOUS | Status: AC
  Filled 2024-03-09: qty 250

## 2024-03-09 MED ORDER — LEVOTHYROXINE SODIUM 75 MCG PO TABS
75.0000 ug | ORAL_TABLET | Freq: Every day | ORAL | Status: DC
  Administered 2024-03-10: 75 ug via ORAL
  Filled 2024-03-09: qty 1

## 2024-03-09 MED ORDER — VITAMIN D 25 MCG (1000 UNIT) PO TABS
2000.0000 [IU] | ORAL_TABLET | Freq: Every day | ORAL | Status: DC
  Administered 2024-03-09 – 2024-03-10 (×2): 2000 [IU] via ORAL
  Filled 2024-03-09 (×6): qty 2

## 2024-03-09 MED ORDER — FENTANYL CITRATE (PF) 100 MCG/2ML IJ SOLN
25.0000 ug | INTRAMUSCULAR | Status: DC | PRN
  Administered 2024-03-09 (×2): 25 ug via INTRAVENOUS
  Administered 2024-03-09: 50 ug via INTRAVENOUS

## 2024-03-09 MED ORDER — SODIUM CHLORIDE 0.9% FLUSH
3.0000 mL | Freq: Two times a day (BID) | INTRAVENOUS | Status: DC
  Administered 2024-03-09: 3 mL via INTRAVENOUS

## 2024-03-09 MED ORDER — DIAZEPAM 5 MG PO TABS: 5.0000 mg | ORAL_TABLET | Freq: Four times a day (QID) | ORAL | Status: DC | PRN

## 2024-03-09 MED ORDER — B-COMPLEX PO CAPS: 1.0000 | ORAL_CAPSULE | Freq: Every day | ORAL | Status: DC

## 2024-03-09 MED ORDER — CHLORHEXIDINE GLUCONATE CLOTH 2 % EX PADS
6.0000 | MEDICATED_PAD | Freq: Once | CUTANEOUS | Status: DC
Start: 2024-03-09 — End: 2024-03-09

## 2024-03-09 MED ORDER — DOCUSATE SODIUM 100 MG PO CAPS
100.0000 mg | ORAL_CAPSULE | Freq: Two times a day (BID) | ORAL | Status: DC
  Administered 2024-03-09: 100 mg via ORAL
  Filled 2024-03-09 (×2): qty 1

## 2024-03-09 MED ORDER — BUPIVACAINE HCL (PF) 0.5 % IJ SOLN
INTRAMUSCULAR | Status: AC
  Filled 2024-03-09: qty 30

## 2024-03-09 MED ORDER — THROMBIN 20000 UNITS EX SOLR
CUTANEOUS | Status: AC
  Filled 2024-03-09: qty 20000

## 2024-03-09 MED ORDER — MIDAZOLAM HCL 2 MG/2ML IJ SOLN
INTRAMUSCULAR | Status: DC | PRN
  Administered 2024-03-09: 2 mg via INTRAVENOUS

## 2024-03-09 MED ORDER — PHENYLEPHRINE HCL-NACL 20-0.9 MG/250ML-% IV SOLN
INTRAVENOUS | Status: DC | PRN
  Administered 2024-03-09: 20 ug/min via INTRAVENOUS

## 2024-03-09 MED ORDER — MIRTAZAPINE 15 MG PO TABS
60.0000 mg | ORAL_TABLET | Freq: Every day | ORAL | Status: DC
  Administered 2024-03-09: 60 mg via ORAL
  Filled 2024-03-09: qty 4

## 2024-03-09 MED ORDER — OXYCODONE HCL 5 MG PO TABS
10.0000 mg | ORAL_TABLET | ORAL | Status: DC | PRN
  Administered 2024-03-10: 10 mg via ORAL
  Filled 2024-03-09: qty 2

## 2024-03-09 MED ORDER — ONDANSETRON 4 MG PO TBDP: 8.0000 mg | ORAL_TABLET | Freq: Three times a day (TID) | ORAL | Status: DC | PRN

## 2024-03-09 MED ORDER — ORAL CARE MOUTH RINSE: 15.0000 mL | Freq: Once | OROMUCOSAL | Status: AC

## 2024-03-09 MED ORDER — ONDANSETRON HCL 4 MG/2ML IJ SOLN
INTRAMUSCULAR | Status: AC
  Filled 2024-03-09: qty 2

## 2024-03-09 MED ORDER — MIRTAZAPINE 15 MG PO TABS
30.0000 mg | ORAL_TABLET | Freq: Every day | ORAL | Status: DC
Start: 2024-03-09 — End: 2024-03-09

## 2024-03-09 MED ORDER — CHLORHEXIDINE GLUCONATE 0.12 % MT SOLN
OROMUCOSAL | Status: AC
  Administered 2024-03-09: 15 mL via OROMUCOSAL
  Filled 2024-03-09: qty 15

## 2024-03-09 MED ORDER — ACETAMINOPHEN 10 MG/ML IV SOLN
INTRAVENOUS | Status: AC
  Filled 2024-03-09: qty 100

## 2024-03-09 MED ORDER — VITAMIN E 45 MG (100 UNIT) PO CAPS
400.0000 [IU] | ORAL_CAPSULE | Freq: Every day | ORAL | Status: DC
  Administered 2024-03-09 – 2024-03-10 (×2): 400 [IU] via ORAL
  Filled 2024-03-09 (×2): qty 4

## 2024-03-09 MED ORDER — ACETAMINOPHEN 10 MG/ML IV SOLN
1000.0000 mg | Freq: Once | INTRAVENOUS | Status: DC | PRN
  Administered 2024-03-09: 1000 mg via INTRAVENOUS

## 2024-03-09 MED ORDER — MIDAZOLAM HCL 2 MG/2ML IJ SOLN
INTRAMUSCULAR | Status: AC
  Filled 2024-03-09: qty 2

## 2024-03-09 MED ORDER — THROMBIN 20000 UNITS EX SOLR: CUTANEOUS | Status: DC | PRN

## 2024-03-09 MED ORDER — FERROUS SULFATE 325 (65 FE) MG PO TABS
325.0000 mg | ORAL_TABLET | ORAL | Status: DC
  Administered 2024-03-10: 325 mg via ORAL
  Filled 2024-03-09: qty 1

## 2024-03-09 MED ORDER — ROSUVASTATIN CALCIUM 5 MG PO TABS
10.0000 mg | ORAL_TABLET | Freq: Every day | ORAL | Status: DC
Start: 2024-03-09 — End: 2024-03-10
  Administered 2024-03-09 – 2024-03-10 (×2): 10 mg via ORAL
  Filled 2024-03-09 (×2): qty 2

## 2024-03-09 MED ORDER — ONDANSETRON HCL 4 MG/2ML IJ SOLN: 4.0000 mg | Freq: Once | INTRAMUSCULAR | Status: DC | PRN

## 2024-03-09 MED ORDER — DEXAMETHASONE SODIUM PHOSPHATE 10 MG/ML IJ SOLN
INTRAMUSCULAR | Status: DC | PRN
  Administered 2024-03-09: 10 mg via INTRAVENOUS

## 2024-03-09 MED ORDER — ONDANSETRON HCL 4 MG PO TABS: 4.0000 mg | ORAL_TABLET | Freq: Four times a day (QID) | ORAL | Status: DC | PRN

## 2024-03-09 MED ORDER — LITHIUM CARBONATE 300 MG PO CAPS
450.0000 mg | ORAL_CAPSULE | Freq: Every day | ORAL | Status: DC
  Administered 2024-03-09 – 2024-03-10 (×2): 450 mg via ORAL
  Filled 2024-03-09 (×2): qty 1

## 2024-03-09 MED ORDER — BUSPIRONE HCL 10 MG PO TABS
30.0000 mg | ORAL_TABLET | Freq: Two times a day (BID) | ORAL | Status: DC
  Administered 2024-03-09 – 2024-03-10 (×2): 30 mg via ORAL
  Filled 2024-03-09 (×2): qty 3

## 2024-03-09 MED ORDER — LACTATED RINGERS IV SOLN: INTRAVENOUS | Status: DC

## 2024-03-09 MED ORDER — B COMPLEX-C PO TABS
1.0000 | ORAL_TABLET | Freq: Every day | ORAL | Status: DC
  Administered 2024-03-09 – 2024-03-10 (×2): 1 via ORAL
  Filled 2024-03-09 (×2): qty 1

## 2024-03-09 MED ORDER — FENTANYL CITRATE (PF) 250 MCG/5ML IJ SOLN
INTRAMUSCULAR | Status: DC | PRN
  Administered 2024-03-09 (×2): 50 ug via INTRAVENOUS
  Administered 2024-03-09 (×2): 25 ug via INTRAVENOUS
  Administered 2024-03-09 (×2): 50 ug via INTRAVENOUS

## 2024-03-09 MED ORDER — ONDANSETRON HCL 4 MG/2ML IJ SOLN
INTRAMUSCULAR | Status: DC | PRN
  Administered 2024-03-09: 4 mg via INTRAVENOUS

## 2024-03-09 MED ORDER — OXYCODONE HCL ER 15 MG PO T12A
15.0000 mg | EXTENDED_RELEASE_TABLET | Freq: Two times a day (BID) | ORAL | Status: DC
  Administered 2024-03-09 – 2024-03-10 (×2): 15 mg via ORAL
  Filled 2024-03-09 (×2): qty 1

## 2024-03-09 MED ORDER — OXYCODONE HCL 5 MG PO TABS
5.0000 mg | ORAL_TABLET | Freq: Once | ORAL | Status: AC | PRN
  Administered 2024-03-09: 5 mg via ORAL

## 2024-03-09 MED ORDER — ONDANSETRON HCL 4 MG/2ML IJ SOLN: 4.0000 mg | Freq: Four times a day (QID) | INTRAMUSCULAR | Status: DC | PRN

## 2024-03-09 MED ORDER — ACETAMINOPHEN 650 MG RE SUPP: 650.0000 mg | RECTAL | Status: DC | PRN

## 2024-03-09 MED ORDER — SUMATRIPTAN SUCCINATE 100 MG PO TABS: 100.0000 mg | ORAL_TABLET | ORAL | Status: DC | PRN

## 2024-03-09 MED ORDER — ZOLPIDEM TARTRATE 5 MG PO TABS: 5.0000 mg | ORAL_TABLET | Freq: Every evening | ORAL | Status: DC | PRN

## 2024-03-09 MED ORDER — PROPOFOL 10 MG/ML IV BOLUS
INTRAVENOUS | Status: AC
  Filled 2024-03-09: qty 20

## 2024-03-09 MED ORDER — LIDOCAINE-EPINEPHRINE 0.5 %-1:200000 IJ SOLN
INTRAMUSCULAR | Status: AC
  Filled 2024-03-09: qty 50

## 2024-03-09 MED ORDER — PHENOL 1.4 % MT LIQD: 1.0000 | OROMUCOSAL | Status: DC | PRN

## 2024-03-09 MED ORDER — ROCURONIUM BROMIDE 10 MG/ML (PF) SYRINGE
PREFILLED_SYRINGE | INTRAVENOUS | Status: AC
Start: 2024-03-09 — End: 2024-03-09
  Filled 2024-03-09: qty 10

## 2024-03-09 MED ORDER — DEXAMETHASONE SODIUM PHOSPHATE 10 MG/ML IJ SOLN
INTRAMUSCULAR | Status: AC
  Filled 2024-03-09: qty 1

## 2024-03-09 MED ORDER — VITAMIN D (ERGOCALCIFEROL) 1.25 MG (50000 UNIT) PO CAPS: 50000.0000 [IU] | ORAL_CAPSULE | ORAL | Status: DC

## 2024-03-09 MED ORDER — OXYCODONE HCL 5 MG/5ML PO SOLN: 5.0000 mg | Freq: Once | ORAL | Status: AC | PRN

## 2024-03-09 MED ORDER — CHLORHEXIDINE GLUCONATE 0.12 % MT SOLN: 15.0000 mL | Freq: Once | OROMUCOSAL | Status: AC

## 2024-03-09 MED ORDER — FENTANYL CITRATE (PF) 100 MCG/2ML IJ SOLN
INTRAMUSCULAR | Status: AC
  Filled 2024-03-09: qty 2

## 2024-03-09 MED ORDER — ALPRAZOLAM 0.25 MG PO TABS: 0.2500 mg | ORAL_TABLET | Freq: Two times a day (BID) | ORAL | Status: DC | PRN

## 2024-03-09 MED ORDER — CEFAZOLIN SODIUM-DEXTROSE 3-4 GM/150ML-% IV SOLN
3.0000 g | INTRAVENOUS | Status: AC
  Administered 2024-03-09: 3 g via INTRAVENOUS
  Filled 2024-03-09: qty 150

## 2024-03-09 MED ORDER — ACETAMINOPHEN 325 MG PO TABS: 650.0000 mg | ORAL_TABLET | ORAL | Status: DC | PRN

## 2024-03-09 MED ORDER — DEXAMETHASONE SODIUM PHOSPHATE 10 MG/ML IJ SOLN
INTRAMUSCULAR | Status: AC
Start: 2024-03-09 — End: 2024-03-09
  Filled 2024-03-09: qty 1

## 2024-03-09 MED ORDER — 0.9 % SODIUM CHLORIDE (POUR BTL) OPTIME
TOPICAL | Status: DC | PRN
  Administered 2024-03-09: 1000 mL

## 2024-03-09 MED ORDER — BIOTIN 10000 MCG PO TABS: 10.0000 mg | ORAL_TABLET | Freq: Every day | ORAL | Status: DC

## 2024-03-09 MED ORDER — OXYCODONE HCL 5 MG PO TABS: 5.0000 mg | ORAL_TABLET | ORAL | Status: DC | PRN

## 2024-03-09 MED ORDER — THROMBIN 5000 UNITS EX KIT
PACK | CUTANEOUS | Status: AC
  Filled 2024-03-09: qty 2

## 2024-03-09 MED ORDER — SODIUM CHLORIDE 0.9% FLUSH: 3.0000 mL | INTRAVENOUS | Status: DC | PRN

## 2024-03-09 MED ORDER — MENTHOL 3 MG MT LOZG: 1.0000 | LOZENGE | OROMUCOSAL | Status: DC | PRN

## 2024-03-09 MED ORDER — LIDOCAINE 2% (20 MG/ML) 5 ML SYRINGE
INTRAMUSCULAR | Status: DC | PRN
  Administered 2024-03-09: 100 mg via INTRAVENOUS

## 2024-03-09 MED ORDER — ROCURONIUM BROMIDE 10 MG/ML (PF) SYRINGE
PREFILLED_SYRINGE | INTRAVENOUS | Status: DC | PRN
  Administered 2024-03-09: 60 mg via INTRAVENOUS

## 2024-03-09 MED ORDER — SODIUM CHLORIDE 0.9 % IV SOLN
250.0000 mL | INTRAVENOUS | Status: DC
  Administered 2024-03-09: 250 mL via INTRAVENOUS

## 2024-03-09 MED ORDER — POTASSIUM CHLORIDE IN NACL 20-0.9 MEQ/L-% IV SOLN
INTRAVENOUS | Status: DC
Start: 2024-03-09 — End: 2024-03-10

## 2024-03-09 MED ORDER — PHENYLEPHRINE 80 MCG/ML (10ML) SYRINGE FOR IV PUSH (FOR BLOOD PRESSURE SUPPORT)
PREFILLED_SYRINGE | INTRAVENOUS | Status: DC | PRN
  Administered 2024-03-09: 160 ug via INTRAVENOUS
  Administered 2024-03-09: 80 ug via INTRAVENOUS

## 2024-03-09 MED ORDER — PROPOFOL 10 MG/ML IV BOLUS
INTRAVENOUS | Status: DC | PRN
  Administered 2024-03-09: 200 mg via INTRAVENOUS

## 2024-03-09 MED ORDER — LIDOCAINE 2% (20 MG/ML) 5 ML SYRINGE
INTRAMUSCULAR | Status: AC
  Filled 2024-03-09: qty 5

## 2024-03-09 MED ORDER — PRAMIPEXOLE DIHYDROCHLORIDE 1 MG PO TABS
1.0000 mg | ORAL_TABLET | ORAL | Status: DC
  Administered 2024-03-09 – 2024-03-10 (×2): 1 mg via ORAL
  Filled 2024-03-09 (×2): qty 1

## 2024-03-09 MED ORDER — METOPROLOL SUCCINATE ER 25 MG PO TB24
25.0000 mg | ORAL_TABLET | Freq: Every day | ORAL | Status: DC
Start: 2024-03-10 — End: 2024-03-10
  Administered 2024-03-10: 25 mg via ORAL
  Filled 2024-03-09: qty 1

## 2024-03-09 MED ORDER — SUGAMMADEX SODIUM 200 MG/2ML IV SOLN
INTRAVENOUS | Status: DC | PRN
  Administered 2024-03-09: 400 mg via INTRAVENOUS

## 2024-03-09 SURGICAL SUPPLY — 48 items
BAG COUNTER SPONGE SURGICOUNT (BAG) ×1 IMPLANT
BENZOIN TINCTURE PRP APPL 2/3 (GAUZE/BANDAGES/DRESSINGS) IMPLANT
BLADE CLIPPER SURG (BLADE) IMPLANT
BUR MATCHSTICK NEURO 3.0 LAGG (BURR) ×1 IMPLANT
BUR PRECISION FLUTE 5.0 (BURR) IMPLANT
CABLE BIPOLOR RESECTION CORD (MISCELLANEOUS) IMPLANT
CANISTER SUCTION 3000ML PPV (SUCTIONS) ×1 IMPLANT
DERMABOND ADVANCED .7 DNX12 (GAUZE/BANDAGES/DRESSINGS) ×1 IMPLANT
DRAPE C-ARM 42X72 X-RAY (DRAPES) IMPLANT
DRAPE LAPAROTOMY 100X72X124 (DRAPES) ×1 IMPLANT
DRAPE MICROSCOPE SLANT 54X150 (MISCELLANEOUS) ×1 IMPLANT
DRAPE SURG 17X23 STRL (DRAPES) ×1 IMPLANT
DRAPE SURG ORHT 6 SPLT 77X108 (DRAPES) IMPLANT
DRSG OPSITE POSTOP 4X6 (GAUZE/BANDAGES/DRESSINGS) IMPLANT
DRSG OPSITE POSTOP 4X8 (GAUZE/BANDAGES/DRESSINGS) IMPLANT
DURAPREP 26ML APPLICATOR (WOUND CARE) ×1 IMPLANT
ELECTRODE REM PT RTRN 9FT ADLT (ELECTROSURGICAL) ×1 IMPLANT
GAUZE 4X4 16PLY ~~LOC~~+RFID DBL (SPONGE) IMPLANT
GAUZE SPONGE 4X4 12PLY STRL (GAUZE/BANDAGES/DRESSINGS) IMPLANT
GLOVE ECLIPSE 6.5 STRL STRAW (GLOVE) ×1 IMPLANT
GLOVE EXAM NITRILE XL STR (GLOVE) IMPLANT
GOWN STRL REUS W/ TWL LRG LVL3 (GOWN DISPOSABLE) ×2 IMPLANT
GOWN STRL REUS W/ TWL XL LVL3 (GOWN DISPOSABLE) IMPLANT
GOWN STRL REUS W/TWL 2XL LVL3 (GOWN DISPOSABLE) IMPLANT
KIT BASIN OR (CUSTOM PROCEDURE TRAY) ×1 IMPLANT
KIT LEAD NEUROSTIM 70 (Lead) IMPLANT
KIT PASSING ELEVATOR NEUROSTIM (MISCELLANEOUS) IMPLANT
KIT TUNNELER SPINAL CORD 35 (KITS) IMPLANT
KIT TURNOVER KIT B (KITS) ×1 IMPLANT
KIT WRENCH TORQUE (KITS) IMPLANT
NDL HYPO 25X1 1.5 SAFETY (NEEDLE) ×1 IMPLANT
NDL SPNL 18GX3.5 QUINCKE PK (NEEDLE) IMPLANT
NEEDLE HYPO 25X1 1.5 SAFETY (NEEDLE) ×1 IMPLANT
NEEDLE SPNL 18GX3.5 QUINCKE PK (NEEDLE) IMPLANT
NS IRRIG 1000ML POUR BTL (IV SOLUTION) ×1 IMPLANT
PACK LAMINECTOMY NEURO (CUSTOM PROCEDURE TRAY) ×1 IMPLANT
PAD ARMBOARD POSITIONER FOAM (MISCELLANEOUS) ×3 IMPLANT
PENCIL BUTTON HOLSTER BLD 10FT (ELECTRODE) IMPLANT
SPIKE FLUID TRANSFER (MISCELLANEOUS) ×1 IMPLANT
SPONGE SURGIFOAM ABS GEL SZ50 (HEMOSTASIS) ×1 IMPLANT
SPONGE T-LAP 4X18 ~~LOC~~+RFID (SPONGE) IMPLANT
STRIP CLOSURE SKIN 1/2X4 (GAUZE/BANDAGES/DRESSINGS) IMPLANT
SUT VIC AB 0 CT1 18XCR BRD8 (SUTURE) ×1 IMPLANT
SUT VIC AB 2-0 CT1 18 (SUTURE) ×1 IMPLANT
SUT VIC AB 3-0 SH 8-18 (SUTURE) ×1 IMPLANT
TOWEL GREEN STERILE (TOWEL DISPOSABLE) ×1 IMPLANT
TOWEL GREEN STERILE FF (TOWEL DISPOSABLE) ×1 IMPLANT
WATER STERILE IRR 1000ML POUR (IV SOLUTION) ×1 IMPLANT

## 2024-03-09 NOTE — Op Note (Signed)
 03/09/2024  8:09 PM  PATIENT:  Natasha Cole  53 y.o. female With a spinal cord stimulator whose leads have pulled out of position. She is admitted for revision of the leads and replacement. 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  BLOOD ADMINISTERED:none  CELL SAVER GIVEN:not used  COUNT:per nursing  DRAINS: none   SPECIMEN:  No Specimen  DICTATION: Mrs. Dicenso was taken to the operating room, intubated and placed under a general anesthetic without difficulty. she was positioned prone on the Cameron table with all pressure points padded. Her back was prepped and draped in a sterile manner. I opened the incision for the spinal cord stimulator with a 10 blade and carried the dissection down to the subcutaneous fascia. I encountered brownish fluid and did send cultures of the fluid to microbiology. I disconnected the leads from the stimulator and tried to pull them out from the stimulator incision. They however were resistant. I opened the incision used to place the leads in the midline. I used the 10 blade then monopolar cautery to expose the leads. I was able to pull them out from the midline incision.  I opened another midline incision with the 10 blade and used monopolar cautery to expose the lamina of T11. I used the Kerrison punches and drill to perform a hemilaminectomy of T11. I placed the paddle lead to the midbody of T9. It veered towards the left side but was deemed acceptable.  I connected the new leads to the stimulator after tunneling them to the stimulator pocket. I  placed a suture around the leads in the midline incision subfascial. I closed the midline incision then in layers closing the subcutaneous plane then the subcuticular plane. I did the same for the existing more caudal incision, and for the stimulator pocket. I applied dermabond and occlusive bandages on the three incisions.  She was rolled onto her bed and extubated. She was moving all extremities. PLAN OF CARE: Admit for overnight observation  PATIENT DISPOSITION:  PACU - hemodynamically stable.   Delay start of Pharmacological VTE agent (>24hrs) due to surgical blood loss or risk of bleeding:  yes

## 2024-03-09 NOTE — Anesthesia Procedure Notes (Signed)
 Procedure Name: Intubation Date/Time: 03/09/2024 4:23 PM  Performed by: Genny Gun, CRNAPre-anesthesia Checklist: Patient identified, Emergency Drugs available, Suction available, Patient being monitored and Timeout performed Patient Re-evaluated:Patient Re-evaluated prior to induction Oxygen Delivery Method: Circle system utilized Preoxygenation: Pre-oxygenation with 100% oxygen Induction Type: IV induction Ventilation: Mask ventilation without difficulty and Oral airway inserted - appropriate to patient size Laryngoscope Size: Glidescope and 3 Grade View: Grade I Tube type: Oral Tube size: 7.0 mm Number of attempts: 1 Placement Confirmation: ETT inserted through vocal cords under direct vision, positive ETCO2 and breath sounds checked- equal and bilateral Secured at: 21 cm Tube secured with: Tape Dental Injury: Teeth and Oropharynx as per pre-operative assessment

## 2024-03-09 NOTE — H&P (Signed)
 BP (!) 189/89   Pulse 64   Temp 98.6 F (37 C) (Oral)   Resp 18   Ht 5' 3 (1.6 m)   Wt (!) 159.7 kg   SpO2 95%   BMI 62.35 kg/m   Natasha Cole comes in today for evaluation of leads that have been pulled out of the thoracic region.  She had percutaneous leads placed for chronic pain syndrome.  She had previously undergone a fusion in the lumbar spine, which appears to be solid.  OBJECTIVE: Vital Signs:  She weighs 354 pounds, temperature is 98.4, blood pressure 130/70, pulse 76.  Pain is 5/10.  Neurologic:  She is alert, oriented by 4.  She answers all questions appropriately.  Memory, language, attention span, and fund of knowledge are normal.  Speech is clear, it is also fluent.  Hearing intact to voice.  She has normal strength 5/5 in the lower and upper extremities.  Normal muscle tone, bulk, coordination.     Natasha Cole's leads pulled out fairly quickly within 48 hours.  I explained that I would be doing an open thoracic laminectomy to place a paddle lead, which is different than the percutaneous leads that had been placed.  I also told her that by having an open exposure, I can tie the leads down, which is what her biggest concerns were about.  Answering those questions, she is happy to proceed and we will get her to the operating room for paddle lead placement.

## 2024-03-10 DIAGNOSIS — M48062 Spinal stenosis, lumbar region with neurogenic claudication: Secondary | ICD-10-CM | POA: Diagnosis not present

## 2024-03-10 MED ORDER — OXYCODONE HCL ER 15 MG PO T12A: 15.0000 mg | EXTENDED_RELEASE_TABLET | Freq: Two times a day (BID) | ORAL | 0 refills | Status: DC

## 2024-03-10 MED ORDER — OXYCODONE HCL 5 MG PO TABS: 5.0000 mg | ORAL_TABLET | ORAL | 0 refills | Status: DC | PRN

## 2024-03-10 MED ORDER — OXYCODONE HCL 5 MG PO TABS: 5.0000 mg | ORAL_TABLET | ORAL | 0 refills | Status: AC | PRN

## 2024-03-10 MED ORDER — TIZANIDINE HCL 4 MG PO TABS: 4.0000 mg | ORAL_TABLET | Freq: Four times a day (QID) | ORAL | 0 refills | Status: AC | PRN

## 2024-03-10 MED ORDER — OXYCODONE HCL ER 15 MG PO T12A: 15.0000 mg | EXTENDED_RELEASE_TABLET | Freq: Two times a day (BID) | ORAL | 0 refills | Status: AC

## 2024-03-10 MED ORDER — TIZANIDINE HCL 4 MG PO TABS: 4.0000 mg | ORAL_TABLET | Freq: Four times a day (QID) | ORAL | 0 refills | Status: DC | PRN

## 2024-03-10 NOTE — Discharge Instructions (Signed)
Lumbar Laminectomy Care After A laminectomy is an operation performed on the spine. The purpose is to decompress the spinal cord and/or the nerve roots.  The time in surgery depends on the findings in surgery and what is necessary to correct the problems. HOME CARE INSTRUCTIONS   Check the cut (incision) made by the surgeon twice a day for signs of infection. Some signs of infection may include:   A foul smelling, greenish or yellowish discharge from the wound.   Increased pain.   Increased redness over the incision (operative) site.   The skin edges may separate.   Flu-like symptoms (problems).   A temperature above 101.5 F (38.6 C).   Change your bandages in about 24 to 36 hours following surgery or as directed.   You may shower tomorrow. Avoid bathtubs, swimming pools and hot tubs for three weeks or until your incision has healed completely. If you have stitches or staples, they may be removed 2 to 3 weeks after surgery, or as directed by your doctor. This may be done by your doctor or caregiver.   You may walk as much as you like. No need to exercise at this time. Limit lifting to ~10lbs.  Weight reduction may be beneficial if you are overweight.   Daily exercise is helpful to prevent the return of problems. Walking is permitted. You may use a treadmill without an incline. Cut down on activities and exercise if you have discomfort. You may also go up and down stairs as much as you can tolerate.   DO NOT lift anything heavier than 10 . Avoid bending or twisting at the waist. Always bend your knees when lifting.   Maintain strength and range of motion as instructed.   Do not drive for 2 to 3 weeks, or as directed by your doctors. You may be a passenger for 20 to 30 minute trips. Lying back in the passenger seat may be more comfortable for you. Always wear a seatbelt.   Limit your sitting in a regular chair to 20 to 30 minutes at a time. There are no limitations for sitting in a  recliner. You should lie down or walk in between sitting periods.   Only take over-the-counter or prescription medicines for pain, discomfort, or fever as directed by your caregiver.  SEEK MEDICAL CARE IF:   There is increased bleeding (more than a small spot) from the wound.   You notice redness, swelling, or increasing pain in the wound.   Pus is coming from wound.   You develop an unexplained oral temperature above 102 F (38.9 C) develops.   You notice a foul smell coming from the wound or dressing.   You have increasing pain in your wound.  SEEK IMMEDIATE MEDICAL CARE IF:   You develop a rash.   You have difficulty breathing.   You develop any allergic problems to medicines given.   

## 2024-03-10 NOTE — Plan of Care (Signed)

## 2024-03-10 NOTE — Progress Notes (Signed)
 Dsicharge summary (AVS) provided to pt with instructions. Pt verbalized understanding of instructions. NO complaints. The problem is the pharmacy for the meds, [facility] is closed today. So on call Meghan PA has been contacted by neuro ofc. Pt remains alert/oriented in no apparent distress.and her family is responsible for her ride.

## 2024-03-10 NOTE — Discharge Summary (Signed)
 Physician Discharge Summary  Patient ID: Natasha Cole MRN: [id number] DOB/AGE: [birth year] 53 y.o.  Admit date: 03/09/2024 Discharge date: 03/10/2024  Admission Diagnoses:  Discharge Diagnoses:  Principal Problem:   Failed spinal cord stimulator [facility])   Discharged Condition: good  Hospital Course: Natasha Cole was admitted and taken to the operating room for removal of percutaneous leads, and placement of a paddle lead via a thoracic laminectomy at T11. The paddle covered all of T10, T11, and the midbody of T9. It was slightly angled to the left side at T9.  Wounds are clean, dry, no signs of infection.  She is voiding, ambulating, and tolerating a regular diet at discharge.   Treatments: surgery: as above  Discharge Exam: Blood pressure (!) 123/55, pulse 79, temperature 98.7 F (37.1 C), temperature source Oral, resp. rate 17, height 5' 3 (1.6 m), weight (!) 159.7 kg, SpO2 95%. 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   B-Complex Caps Take 1 capsule by mouth daily.   Biotin  10000 MCG Tabs Take 10,000 mcg by mouth daily.   busPIRone  30 MG tablet Commonly known as: BUSPAR  TAKE 1 TABLET BY MOUTH TWICE DAILY GENERIC EQUIVALENT FOR BUSPAR    EPINEPHrine  0.3 mg/0.3 mL Soaj injection Commonly known as: EPI-PEN Use as directed for life threatening allergic reactions only   ferrous sulfate  325 (65 FE) MG tablet Take 325 mg by mouth every other day.    levothyroxine  75 MCG tablet Commonly known as: SYNTHROID  Take 75 mcg by mouth daily before breakfast.   lithium  carbonate 150 MG capsule Take 3 capsules (450 mg total) by mouth daily.   methocarbamol  500 MG tablet Commonly known as: Robaxin  Take 1 tablet (500 mg total) by mouth every 6 (six) hours as needed for muscle spasms.   metoprolol  succinate 25 MG 24 hr tablet Commonly known as: TOPROL -XL Take 25 mg by mouth daily.   mirtazapine  45 MG tablet Commonly known as: REMERON  Take 1 tablet (45 mg total) by mouth at bedtime.   mirtazapine  15 MG tablet Commonly known as: REMERON  Take 1 tablet po with 45 mg qhs = 60 mg   ondansetron  8 MG disintegrating tablet Commonly known as: ZOFRAN -ODT Take 8 mg by mouth every 8 (eight) hours as needed for vomiting or nausea.   oxyCODONE  5 MG immediate release tablet Commonly known as: Oxy IR/ROXICODONE  Take 1 tablet (5 mg total) by mouth every 3 (three) hours as needed for moderate pain (pain score 4-6).   oxyCODONE  15 mg 12 hr tablet Commonly known as: OXYCONTIN  Take 1 tablet (15 mg total) by mouth every 12 (twelve) hours for 7 days.   pramipexole  1 MG tablet Commonly known as: MIRAPEX  Take 1 tablet (1 mg total) by mouth in the morning and at bedtime.   prochlorperazine 10 MG tablet Commonly known as: COMPAZINE Take 10 mg by mouth every 6 (six) hours as needed for vomiting or nausea.  rosuvastatin  10 MG tablet Commonly known as: CRESTOR  TAKE 1 TABLET BY MOUTH ONCE DAILY   SUMAtriptan  100 MG tablet Commonly known as: IMITREX  Take 100 mg by mouth every 2 (two) hours as needed for migraine.   tiZANidine  4 MG tablet Commonly known as: ZANAFLEX  Take 1 tablet (4 mg total) by mouth every 6 (six) hours as needed for muscle spasms.   Vitamin D  (Ergocalciferol ) 1.25 MG (50000 UNIT) Caps capsule Commonly known as: DRISDOL  Take 1 capsule (50,000 Units total) by mouth 2 (two) times a week.   Vitamin D  50 MCG (2000 UT) tablet Take 1  tablet (2,000 Units total) by mouth daily.   Vitamin E  180 MG (400 UNIT) Caps Take 400 Units by mouth daily.   zaleplon  10 MG capsule Commonly known as: SONATA  1 p.o. nightly as needed sleep.  May repeat one more for mid nocturnal awakening as long as she has 3 to 4 hours left to sleep.        Follow-up Information     Gillie Duncans, MD Follow up.   Specialty: Neurosurgery Why: keep your scheduled appointment for three weeks Contact information: [address] [place] [address] [phone]                 Signed: Duncans Gillie 03/10/2024, 8:03 AM

## 2024-03-11 NOTE — Anesthesia Postprocedure Evaluation (Signed)
 Anesthesia Post Note  Patient: Natasha Cole  Procedure(s) Performed: Thoracic laminectomy with Spinal Cord Stimulator Placement (Back)     Patient location during evaluation: PACU Anesthesia Type: General Level of consciousness: awake and alert Pain management: pain level controlled Vital Signs Assessment: post-procedure vital signs reviewed and stable Respiratory status: spontaneous breathing, nonlabored ventilation, respiratory function stable and patient connected to nasal cannula oxygen Cardiovascular status: blood pressure returned to baseline and stable Postop Assessment: no apparent nausea or vomiting Anesthetic complications: no   No notable events documented.  Last Vitals:  Vitals:   03/10/24 0343 03/10/24 0806  BP: (!) 123/55 128/64  Pulse: 79 75  Resp: 17 19  Temp: 37.1 C 37.1 C  SpO2: 95% 94%    Last Pain:  Vitals:   03/10/24 0806  TempSrc: Oral  PainSc:                  Thom JONELLE Peoples

## 2024-03-11 NOTE — Transfer of Care (Signed)
 Immediate Anesthesia Transfer of Care Note  Patient: Natasha Cole  Procedure(s) Performed: Thoracic laminectomy with Spinal Cord Stimulator Placement (Back)  Patient Location: PACU  Anesthesia Type:General  Level of Consciousness: awake  Airway & Oxygen Therapy: Patient Spontanous Breathing  Post-op Assessment: Report given to RN and Post -op Vital signs reviewed and stable  Post vital signs: stable  Last Vitals:  Vitals Value Taken Time  BP 128/64 03/10/24 08:06  Temp 37.1 C 03/10/24 08:06  Pulse 75 03/10/24 08:06  Resp 19 03/10/24 08:06  SpO2 94 % 03/10/24 08:06    Last Pain:  Vitals:   03/10/24 0806  TempSrc: Oral  PainSc:       Patients Stated Pain Goal: 3 (03/09/24 1928)  Complications: No notable events documented.

## 2024-03-12 ENCOUNTER — Other Ambulatory Visit: Payer: Self-pay

## 2024-03-14 ENCOUNTER — Encounter (HOSPITAL_COMMUNITY): Payer: Self-pay | Admitting: Neurosurgery

## 2024-03-14 LAB — AEROBIC/ANAEROBIC CULTURE W GRAM STAIN (SURGICAL/DEEP WOUND)
Culture: NO GROWTH
Gram Stain: NONE SEEN

## 2024-03-15 ENCOUNTER — Telehealth: Payer: Self-pay | Admitting: Physician Assistant

## 2024-03-15 DIAGNOSIS — G2581 Restless legs syndrome: Secondary | ICD-10-CM

## 2024-03-15 MED ORDER — PRAMIPEXOLE DIHYDROCHLORIDE 1 MG PO TABS: 1.0000 mg | ORAL_TABLET | Freq: Two times a day (BID) | ORAL | 0 refills | Status: DC

## 2024-03-15 NOTE — Telephone Encounter (Signed)
 Pt called at 10:01a requesting refill of Pramipexole  to   [facility] [place], [place] - [address] [facility] [address] [place], [place] [address] Phone: [phone]  Fax: [phone]   She said she's having trouble getting it locally.  Next appt 12/16

## 2024-03-15 NOTE — Telephone Encounter (Signed)
 Sent!

## 2024-03-26 ENCOUNTER — Telehealth: Payer: Self-pay | Admitting: Physician Assistant

## 2024-03-26 DIAGNOSIS — G2581 Restless legs syndrome: Secondary | ICD-10-CM

## 2024-03-26 MED ORDER — PRAMIPEXOLE DIHYDROCHLORIDE 1 MG PO TABS: 1.0000 mg | ORAL_TABLET | Freq: Two times a day (BID) | ORAL | 0 refills | Status: DC

## 2024-03-26 NOTE — Telephone Encounter (Signed)
 Sent!

## 2024-03-26 NOTE — Telephone Encounter (Signed)
 Pt called and said that [facility] mail order will no longer carrymirapex 1 mg. Please cancel that script and resend to [facility] located at [address] in [place]

## 2024-06-06 ENCOUNTER — Other Ambulatory Visit: Payer: Self-pay | Admitting: Physician Assistant

## 2024-06-11 ENCOUNTER — Telehealth: Admitting: Physician Assistant

## 2024-06-12 ENCOUNTER — Telehealth: Admitting: Physician Assistant

## 2024-06-12 ENCOUNTER — Encounter: Payer: Self-pay | Admitting: Physician Assistant

## 2024-06-12 DIAGNOSIS — G2581 Restless legs syndrome: Secondary | ICD-10-CM

## 2024-06-12 DIAGNOSIS — F329 Major depressive disorder, single episode, unspecified: Secondary | ICD-10-CM

## 2024-06-12 DIAGNOSIS — F411 Generalized anxiety disorder: Secondary | ICD-10-CM

## 2024-06-12 DIAGNOSIS — G4700 Insomnia, unspecified: Secondary | ICD-10-CM

## 2024-06-12 MED ORDER — MIRTAZAPINE 45 MG PO TABS: 45.0000 mg | ORAL_TABLET | Freq: Every day | ORAL | 3 refills | Status: AC

## 2024-06-12 MED ORDER — BUSPIRONE HCL 30 MG PO TABS: 30.0000 mg | ORAL_TABLET | Freq: Two times a day (BID) | ORAL | 3 refills | Status: AC

## 2024-06-12 MED ORDER — ZOLPIDEM TARTRATE 10 MG PO TABS: 10.0000 mg | ORAL_TABLET | Freq: Every evening | ORAL | 1 refills | Status: AC | PRN

## 2024-06-12 MED ORDER — PRAMIPEXOLE DIHYDROCHLORIDE 1 MG PO TABS: 1.0000 mg | ORAL_TABLET | Freq: Two times a day (BID) | ORAL | 1 refills | Status: AC

## 2024-06-12 NOTE — Progress Notes (Signed)
 [facility]  Patient ID: Natasha Cole,  MRN: [id number]  PCP: Benson Eleanor Rung, NP   Date of Evaluation: 06/12/2024 Time spent:25 minutes  Chief Complaint: Depression, anxiety, follow-up  Virtual Visit via Telehealth  I connected with patient by a video enabled telemedicine application with their informed consent, and verified patient privacy and that I am speaking with the correct person using two identifiers.  I am private, in my office and the patient is at home.  I discussed the limitations, risks, security and privacy concerns of performing an evaluation and management service by video and the availability of in person appointments. I also discussed with the patient that there may be a patient responsible charge related to this service. The patient expressed understanding and agreed to proceed.   I discussed the assessment and treatment plan with the patient. The patient was provided an opportunity to ask questions and all were answered. The patient agreed with the plan and demonstrated an understanding of the instructions.   The patient was advised to call back or seek an in-person evaluation if the symptoms worsen or if the condition fails to improve as anticipated.  I provided 25 minutes of non-face-to-face time during this encounter.  HISTORY/CURRENT STATUS: For routine 83-month med check.   She is not sleeping well even with the Sonata .  It puts her to sleep but does not keep her asleep.  She only gets about 3 to 4 hours every night.  She does not nap during the day although she nods off sometimes.  She never feels rested.  Other than the above, she feels stable.  The holidays are hard for her but states she's doing ok.  Energy and motivation are good.   No extreme sadness, tearfulness, or feelings of hopelessness.  ADLs and personal hygiene are normal.   Denies any changes in concentration, making decisions, or remembering things.  Appetite has not  changed.  No mania, delirium, AH/VH.  No SI/HI.  Individual Medical History/ Review of Systems: Changes? :Yes   had a spinal stimulator placed in September.  States it is not helping.  Continues to have chronic back pain.      Past medications for mental health diagnoses include: Lithium  (dose higher than 450 mg daily caused slurred speech) Seroquel, Latuda, Abilify, Remeron , BuSpar , Effexor, Deplin, Rexulti, Risperdal, Cymbalta, Prozac, Pristiq, Lamictal , Wellbutrin, trazodone, Viibryd, Zoloft, Mirapex , Klonopin, imipramine  caused rash and itching, Lamictal  caused rash  Allergies: Imipramine , Lamictal  [lamotrigine ], and Nsaids  Current Medications:  Current Outpatient Medications:    ALPRAZolam  (XANAX ) 0.5 MG tablet, Take 0.5-1.5 tablets (0.25-0.75 mg total) by mouth 2 (two) times daily as needed for anxiety., Disp: 60 tablet, Rfl: 5   B-Complex CAPS, Take 1 capsule by mouth daily., Disp: 30 capsule, Rfl:    Biotin  10000 MCG TABS, Take 10,000 mcg by mouth daily., Disp: , Rfl:    Cholecalciferol  (VITAMIN D ) 50 MCG (2000 UT) tablet, Take 1 tablet (2,000 Units total) by mouth daily., Disp: 30 tablet, Rfl: 11   ferrous sulfate  325 (65 FE) MG tablet, Take 325 mg by mouth every other day., Disp: , Rfl:    levothyroxine  (SYNTHROID ) 75 MCG tablet, Take 75 mcg by mouth daily before breakfast., Disp: , Rfl:    lithium  carbonate 150 MG capsule, Take 3 capsules (450 mg total) by mouth daily., Disp: 270 capsule, Rfl: 1   metoprolol  succinate (TOPROL -XL) 25 MG 24 hr tablet, Take 25 mg by mouth daily., Disp: , Rfl:    ondansetron  (  ZOFRAN -ODT) 8 MG disintegrating tablet, Take 8 mg by mouth every 8 (eight) hours as needed for vomiting or nausea., Disp: , Rfl:    prochlorperazine (COMPAZINE) 10 MG tablet, Take 10 mg by mouth every 6 (six) hours as needed for vomiting or nausea., Disp: , Rfl:    rosuvastatin  (CRESTOR ) 10 MG tablet, TAKE 1 TABLET BY MOUTH ONCE DAILY, Disp: , Rfl:    SUMAtriptan  (IMITREX ) 100 MG  tablet, Take 100 mg by mouth every 2 (two) hours as needed for migraine., Disp: , Rfl:    Vitamin D , Ergocalciferol , (DRISDOL ) 1.25 MG (50000 UNIT) CAPS capsule, Take 1 capsule (50,000 Units total) by mouth 2 (two) times a week., Disp: 24 capsule, Rfl: 1   Vitamin E  180 MG (400 UNIT) CAPS, Take 400 Units by mouth daily., Disp: , Rfl:    zolpidem  (AMBIEN ) 10 MG tablet, Take 1 tablet (10 mg total) by mouth at bedtime as needed for sleep., Disp: 30 tablet, Rfl: 1   busPIRone  (BUSPAR ) 30 MG tablet, Take 1 tablet (30 mg total) by mouth 2 (two) times daily. TAKE 1 TABLET BY MOUTH TWICE DAILY GENERIC EQUIVALENT FOR BUSPAR , Disp: 180 tablet, Rfl: 3   EPINEPHrine  0.3 mg/0.3 mL IJ SOAJ injection, Use as directed for life threatening allergic reactions only (Patient not taking: Reported on 03/06/2024), Disp: 2 each, Rfl: 3   methocarbamol  (ROBAXIN ) 500 MG tablet, Take 1 tablet (500 mg total) by mouth every 6 (six) hours as needed for muscle spasms. (Patient not taking: Reported on 12/31/2021), Disp: 30 tablet, Rfl: 1   mirtazapine  (REMERON ) 15 MG tablet, TAKE 1 TABLET BY MOUTH WITH 45 MG EVERY NIGHT AT BEDTIME=60 MG, Disp: 90 tablet, Rfl: 0   mirtazapine  (REMERON ) 45 MG tablet, Take 1 tablet (45 mg total) by mouth at bedtime., Disp: 90 tablet, Rfl: 3   oxyCODONE  (OXY IR/ROXICODONE ) 5 MG immediate release tablet, Take 1 tablet (5 mg total) by mouth every 3 (three) hours as needed for moderate pain (pain score 4-6)., Disp: 30 tablet, Rfl: 0   pramipexole  (MIRAPEX ) 1 MG tablet, Take 1 tablet (1 mg total) by mouth in the morning and at bedtime., Disp: 180 tablet, Rfl: 1   tiZANidine  (ZANAFLEX ) 4 MG tablet, Take 1 tablet (4 mg total) by mouth every 6 (six) hours as needed for muscle spasms., Disp: 60 tablet, Rfl: 0  Current Facility-Administered Medications:    omalizumab  (XOLAIR ) injection 300 mg, 300 mg, Subcutaneous, Q28 days, Kozlow, Eric J, MD, 300 mg at 04/17/20 0930 Medication Side Effects: none  Family Medical/  Social History: Changes? No  MENTAL HEALTH EXAM:  There were no vitals taken for this visit.There is no height or weight on file to calculate BMI.  General Appearance: Casual, Well Groomed, and Obese  Eye Contact:  Good  Speech:  Clear and Coherent  Volume:  Normal  Mood:  Euthymic  Affect:  Congruent  Thought Process:  Goal Directed and Descriptions of Associations: Circumstantial  Orientation:  Full (Time, Place, and Person)  Thought Content: Logical   Suicidal Thoughts:  No  Homicidal Thoughts:  No  Memory:  WNL  Judgement:  Good  Insight:  Good  Psychomotor Activity:  Normal  Concentration:  Concentration: Good and Attention Span: Good  Recall:  Good  Fund of Knowledge: Good  Language: Good  Assets:  Communication Skills Desire for Improvement Financial Resources/Insurance Housing Transportation  ADL's:  Intact  Cognition: WNL  Prognosis:  Good   DIAGNOSES:    ICD-10-CM  1. 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 Restless leg syndrome  G25.81 pramipexole  (MIRAPEX ) 1 MG tablet    4. Insomnia, unspecified type  G47.00      Receivng Psychotherapy: No   RECOMMENDATIONS:  PDMP was reviewed.  Sonata  filled 05/17/2024.  Xanax  filled 03/12/2024.  Oxycodone  known to me, last filled 03/12/2024. I provided approximately 25 minutes of non-face to face time during this encounter, including time spent before and after the visit in records review, medical decision making, counseling pertinent to today's visit, and charting.   Sleep hygiene discussed.  I recommend changing Sonata  to Ambien .  Pros and cons were discussed and she would like to try it.  Okay to send in a refill if needed before her next visit.  If this prescription is not effective enough I will switch her to Ambien  CR 12.5 mg.  She will call if that is the case.  No other changes are needed.  Continue Xanax  0.5 mg, 1/2 to 1-1/2 pills twice daily as needed  anxiety. Continue BuSpar  30 mg, 1 p.o. twice daily. Continue levothyroxine  Rx by PCP. Continue lithium  150 mg, 3 p.o. nightly. Continue mirtazapine  15 mg +45 mg = 60 mg nightly. Continue pramipexole  1 mg, 1 p.o. twice daily.   Start Ambien  10 mg, 1 p.o. nightly as needed sleep. Return in 3 months.  Verneita Cooks, PA-C

## 2024-06-19 ENCOUNTER — Other Ambulatory Visit: Payer: Self-pay | Admitting: Physician Assistant

## 2024-06-19 DIAGNOSIS — R7989 Other specified abnormal findings of blood chemistry: Secondary | ICD-10-CM

## 2024-07-04 ENCOUNTER — Telehealth: Payer: Self-pay | Admitting: Physician Assistant

## 2024-07-04 NOTE — Telephone Encounter (Signed)
 Pt LVM @ 9:55a stating she doesn't know if Verneita wants her to keep taking the Vitamin D2 50k since her levels are ok now.  If Verneita wants her to continue taking it, pls send script to   [facility] - [place], [place] - [address] [place], [place] [address] Phone: [phone]  Fax: [phone]   Next appt  3/18

## 2024-07-04 NOTE — Telephone Encounter (Signed)
 Pt no longer needs to be on vitamin D . Patient informed. Natasha Cole had previously refused RF.

## 2024-09-12 ENCOUNTER — Telehealth: Admitting: Physician Assistant
